# Patient Record
Sex: Female | Born: 1990 | Race: Asian | Hispanic: No | Marital: Married | Smoking: Never smoker
Health system: Southern US, Community
[De-identification: ages and names within clinical notes are randomized; demographics above are authoritative.]

## PROBLEM LIST (undated history)

## (undated) DIAGNOSIS — IMO0002 Reserved for concepts with insufficient information to code with codable children: Secondary | ICD-10-CM

## (undated) DIAGNOSIS — Z789 Other specified health status: Secondary | ICD-10-CM

## (undated) HISTORY — PX: NO PAST SURGERIES: SHX2092

## (undated) HISTORY — PX: HAND SURGERY: SHX662

## (undated) HISTORY — DX: Other specified health status: Z78.9

---

## 2011-09-15 ENCOUNTER — Emergency Department (HOSPITAL_COMMUNITY)
Admission: EM | Admit: 2011-09-15 | Discharge: 2011-09-16 | Disposition: A | Discharge status: Home / Self Care | Payer: Self-pay | Attending: Emergency Medicine | Admitting: Emergency Medicine

## 2011-09-15 ENCOUNTER — Encounter (HOSPITAL_COMMUNITY): Payer: Self-pay | Admitting: *Deleted

## 2011-09-15 DIAGNOSIS — W260XXA Contact with knife, initial encounter: Secondary | ICD-10-CM | POA: Insufficient documentation

## 2011-09-15 DIAGNOSIS — IMO0001 Reserved for inherently not codable concepts without codable children: Secondary | ICD-10-CM

## 2011-09-15 DIAGNOSIS — W01119A Fall on same level from slipping, tripping and stumbling with subsequent striking against unspecified sharp object, initial encounter: Secondary | ICD-10-CM | POA: Insufficient documentation

## 2011-09-15 DIAGNOSIS — R29898 Other symptoms and signs involving the musculoskeletal system: Secondary | ICD-10-CM | POA: Insufficient documentation

## 2011-09-15 DIAGNOSIS — S61209A Unspecified open wound of unspecified finger without damage to nail, initial encounter: Secondary | ICD-10-CM | POA: Insufficient documentation

## 2011-09-15 DIAGNOSIS — W261XXA Contact with sword or dagger, initial encounter: Secondary | ICD-10-CM | POA: Insufficient documentation

## 2011-09-15 NOTE — ED Provider Notes (Signed)
History     CSN: [id number]  Arrival date & time 09/15/11  2050   First MD Initiated Contact with Patient 09/15/11 2115      Chief Complaint  Patient presents with  . Laceration     HPI  History provided by the patient. Patient is 21 year old female with no significant past medical history who presents with complaints of laceration to her left index finger. Patient reports having a fall while holding a knife and cut her left finger. Patient states she had gone out to the store to get a knife for her mother who was cooking. On her way back into the house she slipped on ice while carrying a knife. There was associated bleeding which has now stopped. Patient reports continued pain to the finger especially with palpation or movements. Patient denies any numbness but does report some weakness in the finger. Patient has no other significant medical problems. Patient reports being up-to-date on tetanus shot.     History reviewed. No pertinent past medical history.  History reviewed. No pertinent past surgical history.  History reviewed. No pertinent family history.  History  Substance Use Topics  . Smoking status: Never Smoker   . Smokeless tobacco: Not on file  . Alcohol Use: No    OB History    Grav Para Term Preterm Abortions TAB SAB Ect Mult Living                  Review of Systems  All other systems reviewed and are negative.    Allergies  Review of patient's allergies indicates no known allergies.  Home Medications  No current outpatient prescriptions on file.  BP 103/63  Pulse 60  Temp(Src) 98.2 F (36.8 C) (Oral)  Resp 18  SpO2 100%  Physical Exam  Nursing note and vitals reviewed. Constitutional: She is oriented to person, place, and time. She appears well-developed and well-nourished. No distress.  HENT:  Head: Normocephalic and atraumatic.  Cardiovascular: Normal rate and regular rhythm.   Pulmonary/Chest: Effort normal and breath sounds normal.    Musculoskeletal:       2 cm laceration over the palmar surface of left index finger over the MCP joint. Patient has reduced range of motion and weakness of flexion at DIP joint of index finger. She reports normal distal medial and lateral sensations of finger. There is normal cap Refill of finger.  Neurological: She is alert and oriented to person, place, and time.  Skin: Skin is warm and dry. No rash noted.  Psychiatric: She has a normal mood and affect. Her behavior is normal.    ED Course  Procedures   LACERATION REPAIR Performed by: Angus Seller Authorized by: Angus Seller Consent: Verbal consent obtained. Risks and benefits: risks, benefits and alternatives were discussed Consent given by: patient Patient identity confirmed: provided demographic data Prepped and Draped in normal sterile fashion  Wound explored through full range of motion. There does not appear to be any tendon involvement.  Laceration Location: Left index finger  Laceration Length: 2 cm  No Foreign Bodies seen or palpated  Anesthesia: local infiltration  Local anesthetic: lidocaine 2 % without epinephrine  Anesthetic total: 3 ml  Irrigation method: syringe Amount of cleaning: standard  Skin closure: 4-0 nylon   Number of sutures: 5   Technique: Simple interrupted   Patient tolerance: Patient tolerated the procedure well with no immediate complications.      1. Laceration of second finger of left hand with tendon involvement  MDM  9:15 PM patient seen and evaluated. Patient in no acute distress.  10:00 PM patient discussed with attending physician. He recommends calling orthopedic hand specialist for consult.   11:00 PM spoke with Dr. Melvyn Novas on-call with hand surgery. He would like the wound closed with sutures and he will come down to evaluate patient.  12:00 AM patient has been seen by Dr. Melvyn Novas. He requests that patient be given instructions to return to the emergency  room tomorrow at 1 PM to be seen by him and taken to surgery. He would like patient placed in a finger splint. Patient will also be given instructions to be n.p.o. 8 hours prior to arrival.   Medical screening examination/treatment/procedure(s) were performed by non-physician practitioner and as supervising physician I was immediately available for consultation/collaboration. Osvaldo Human, M.D.    Angus Seller, PA 09/16/11 0032  Carleene Cooper III, MD 09/16/11 [phone]

## 2011-09-15 NOTE — ED Notes (Signed)
Laceration ot the lt index finger she fell onto her knife she carries with her

## 2011-09-15 NOTE — ED Notes (Signed)
P. DAMMEN PA AT BEDSIDE SUTURING PT.'S LACERATION .

## 2011-09-16 ENCOUNTER — Emergency Department (HOSPITAL_COMMUNITY): Payer: Self-pay | Admitting: Anesthesiology

## 2011-09-16 ENCOUNTER — Encounter (HOSPITAL_COMMUNITY): Payer: Self-pay | Admitting: Anesthesiology

## 2011-09-16 ENCOUNTER — Encounter (HOSPITAL_COMMUNITY): Payer: Self-pay | Admitting: *Deleted

## 2011-09-16 ENCOUNTER — Encounter (HOSPITAL_COMMUNITY)
Admission: EM | Disposition: A | Discharge status: Home / Self Care | Payer: Self-pay | Source: Home / Self Care | Attending: Emergency Medicine

## 2011-09-16 ENCOUNTER — Ambulatory Visit (HOSPITAL_COMMUNITY)
Admission: EM | Admit: 2011-09-16 | Discharge: 2011-09-16 | Disposition: A | Discharge status: Home / Self Care | Payer: Self-pay | Attending: Emergency Medicine | Admitting: Emergency Medicine

## 2011-09-16 DIAGNOSIS — W269XXA Contact with unspecified sharp object(s), initial encounter: Secondary | ICD-10-CM | POA: Insufficient documentation

## 2011-09-16 DIAGNOSIS — S61219A Laceration without foreign body of unspecified finger without damage to nail, initial encounter: Secondary | ICD-10-CM

## 2011-09-16 DIAGNOSIS — Y92009 Unspecified place in unspecified non-institutional (private) residence as the place of occurrence of the external cause: Secondary | ICD-10-CM | POA: Insufficient documentation

## 2011-09-16 DIAGNOSIS — IMO0002 Reserved for concepts with insufficient information to code with codable children: Secondary | ICD-10-CM | POA: Insufficient documentation

## 2011-09-16 DIAGNOSIS — Y998 Other external cause status: Secondary | ICD-10-CM | POA: Insufficient documentation

## 2011-09-16 DIAGNOSIS — S61209A Unspecified open wound of unspecified finger without damage to nail, initial encounter: Secondary | ICD-10-CM | POA: Insufficient documentation

## 2011-09-16 LAB — HCG, SERUM, QUALITATIVE: Preg, Serum: NEGATIVE

## 2011-09-16 SURGERY — ARTERY AND TENDON REPAIR
Anesthesia: General | Site: Finger | Laterality: Left | Wound class: Clean

## 2011-09-16 MED ORDER — LACTATED RINGERS IV SOLN
INTRAVENOUS | Status: DC | PRN
  Administered 2011-09-16 (×2): via INTRAVENOUS

## 2011-09-16 MED ORDER — MIDAZOLAM HCL 5 MG/5ML IJ SOLN
INTRAMUSCULAR | Status: DC | PRN
  Administered 2011-09-16: 2 mg via INTRAVENOUS

## 2011-09-16 MED ORDER — FENTANYL CITRATE 0.05 MG/ML IJ SOLN
INTRAMUSCULAR | Status: DC | PRN
  Administered 2011-09-16: 50 ug via INTRAVENOUS
  Administered 2011-09-16: 100 ug via INTRAVENOUS

## 2011-09-16 MED ORDER — DOCUSATE SODIUM 100 MG PO CAPS: 100.0000 mg | ORAL_CAPSULE | Freq: Two times a day (BID) | ORAL | Status: AC

## 2011-09-16 MED ORDER — OXYCODONE-ACETAMINOPHEN 5-325 MG PO TABS: 1.0000 | ORAL_TABLET | ORAL | Status: AC | PRN

## 2011-09-16 MED ORDER — PROPOFOL 10 MG/ML IV EMUL
INTRAVENOUS | Status: DC | PRN
  Administered 2011-09-16: 50 mg via INTRAVENOUS
  Administered 2011-09-16: 200 mg via INTRAVENOUS

## 2011-09-16 MED ORDER — BUPIVACAINE HCL (PF) 0.25 % IJ SOLN
INTRAMUSCULAR | Status: DC | PRN
  Administered 2011-09-16: 8 mL

## 2011-09-16 MED ORDER — ONDANSETRON HCL 4 MG/2ML IJ SOLN
INTRAMUSCULAR | Status: DC | PRN
  Administered 2011-09-16: 4 mg via INTRAVENOUS

## 2011-09-16 MED ORDER — ACETAMINOPHEN 10 MG/ML IV SOLN
INTRAVENOUS | Status: DC | PRN
  Administered 2011-09-16: 1000 mg via INTRAVENOUS

## 2011-09-16 MED ORDER — CEFAZOLIN SODIUM 1-5 GM-% IV SOLN
INTRAVENOUS | Status: DC | PRN
  Administered 2011-09-16: 1 g via INTRAVENOUS

## 2011-09-16 SURGICAL SUPPLY — 54 items
APPLIER CLIP 9.375 SM OPEN (CLIP)
BAG DECANTER FOR FLEXI CONT (MISCELLANEOUS) IMPLANT
BANDAGE ELASTIC 3 VELCRO ST LF (GAUZE/BANDAGES/DRESSINGS) ×2 IMPLANT
BANDAGE ELASTIC 4 VELCRO ST LF (GAUZE/BANDAGES/DRESSINGS) IMPLANT
BANDAGE GAUZE ELAST BULKY 4 IN (GAUZE/BANDAGES/DRESSINGS) ×2 IMPLANT
BNDG ESMARK 4X9 LF (GAUZE/BANDAGES/DRESSINGS) ×2 IMPLANT
CLIP APPLIE 9.375 SM OPEN (CLIP) IMPLANT
CLOTH BEACON ORANGE TIMEOUT ST (SAFETY) ×2 IMPLANT
CORDS BIPOLAR (ELECTRODE) ×2 IMPLANT
COVER SURGICAL LIGHT HANDLE (MISCELLANEOUS) ×2 IMPLANT
CUFF TOURNIQUET SINGLE 18IN (TOURNIQUET CUFF) ×2 IMPLANT
CUFF TOURNIQUET SINGLE 24IN (TOURNIQUET CUFF) IMPLANT
DRAPE OEC MINIVIEW 54X84 (DRAPES) IMPLANT
DRAPE SURG 17X23 STRL (DRAPES) ×2 IMPLANT
DRSG ADAPTIC 3X8 NADH LF (GAUZE/BANDAGES/DRESSINGS) IMPLANT
DRSG EMULSION OIL 3X3 NADH (GAUZE/BANDAGES/DRESSINGS) ×2 IMPLANT
GAUZE XEROFORM 5X9 LF (GAUZE/BANDAGES/DRESSINGS) ×2 IMPLANT
GEL ULTRASOUND 20GR AQUASONIC (MISCELLANEOUS) IMPLANT
GLOVE BIOGEL PI IND STRL 8.5 (GLOVE) ×1 IMPLANT
GLOVE BIOGEL PI INDICATOR 8.5 (GLOVE) ×1
GLOVE SURG ORTHO 8.0 STRL STRW (GLOVE) ×2 IMPLANT
GOWN PREVENTION PLUS XLARGE (GOWN DISPOSABLE) ×2 IMPLANT
GOWN STRL NON-REIN LRG LVL3 (GOWN DISPOSABLE) ×2 IMPLANT
KIT BASIN OR (CUSTOM PROCEDURE TRAY) ×2 IMPLANT
KIT ROOM TURNOVER OR (KITS) ×2 IMPLANT
LOOP VESSEL MAXI BLUE (MISCELLANEOUS) IMPLANT
MANIFOLD NEPTUNE II (INSTRUMENTS) IMPLANT
NEEDLE HYPO 25GX1X1/2 BEV (NEEDLE) IMPLANT
NS IRRIG 1000ML POUR BTL (IV SOLUTION) ×2 IMPLANT
PACK ORTHO EXTREMITY (CUSTOM PROCEDURE TRAY) ×2 IMPLANT
PAD ARMBOARD 7.5X6 YLW CONV (MISCELLANEOUS) ×2 IMPLANT
PAD CAST 4YDX4 CTTN HI CHSV (CAST SUPPLIES) IMPLANT
PADDING CAST COTTON 4X4 STRL (CAST SUPPLIES)
PADDING WEBRIL 3 STERILE (GAUZE/BANDAGES/DRESSINGS) ×2 IMPLANT
SOAP 2 % CHG 4 OZ (WOUND CARE) ×2 IMPLANT
SPEAR EYE SURG WECK-CEL (MISCELLANEOUS) IMPLANT
SPLINT PLASTER 3X15 (CAST SUPPLIES) ×2 IMPLANT
SPONGE GAUZE 4X4 12PLY (GAUZE/BANDAGES/DRESSINGS) IMPLANT
SPONGE GAUZE 4X4 STERILE 39 (GAUZE/BANDAGES/DRESSINGS) ×2 IMPLANT
SUCTION FRAZIER TIP 10 FR DISP (SUCTIONS) ×2 IMPLANT
SUT FIBERWIRE 4-0 18 DIAM BLUE (SUTURE)
SUT FIBERWIRE 4-0 18 TAPR NDL (SUTURE) ×6
SUT MERSILENE 4 0 P 3 (SUTURE) IMPLANT
SUT PROLENE 4 0 P 3 18 (SUTURE) ×2 IMPLANT
SUT PROLENE 4 0 PS 2 18 (SUTURE) IMPLANT
SUT PROLENE 6 0 PC 1 (SUTURE) ×2 IMPLANT
SUTURE FIBERWR 4-0 18 DIA BLUE (SUTURE) IMPLANT
SUTURE FIBERWR 4-0 18 TAPR NDL (SUTURE) ×3 IMPLANT
SYR CONTROL 10ML LL (SYRINGE) IMPLANT
TOWEL OR 17X24 6PK STRL BLUE (TOWEL DISPOSABLE) ×2 IMPLANT
TOWEL OR 17X26 10 PK STRL BLUE (TOWEL DISPOSABLE) ×2 IMPLANT
TUBE CONNECTING 12X1/4 (SUCTIONS) ×2 IMPLANT
UNDERPAD 30X30 INCONTINENT (UNDERPADS AND DIAPERS) ×2 IMPLANT
WATER STERILE IRR 1000ML POUR (IV SOLUTION) ×2 IMPLANT

## 2011-09-16 NOTE — Preoperative (Signed)
Beta Blockers   Reason not to administer Beta Blockers:Not Applicable 

## 2011-09-16 NOTE — ED Notes (Signed)
Ortho enroute to stretcher 8

## 2011-09-16 NOTE — ED Notes (Signed)
Sent here for pre-op care, having surgery to left index finger by dr Orlan Leavens today at 1pm.

## 2011-09-16 NOTE — Anesthesia Procedure Notes (Signed)
Procedure Name: LMA Insertion Date/Time: 09/16/2011 4:26 PM Performed by: Glendora Score Pre-anesthesia Checklist: Patient identified, Emergency Drugs available, Suction available and Patient being monitored Patient Re-evaluated:Patient Re-evaluated prior to inductionOxygen Delivery Method: Circle System Utilized Preoxygenation: Pre-oxygenation with 100% oxygen Intubation Type: IV induction Ventilation: Mask ventilation without difficulty LMA: LMA inserted LMA Size: 3.0 Number of attempts: 1 Placement Confirmation: positive ETCO2 and breath sounds checked- equal and bilateral Tube secured with: Tape Dental Injury: Teeth and Oropharynx as per pre-operative assessment

## 2011-09-16 NOTE — ED Provider Notes (Signed)
History     CSN: [id number]  Arrival date & time 09/16/11  1236   First MD Initiated Contact with Patient 09/16/11 1302      Chief Complaint  Patient presents with  . Pre-op Exam    (Consider location/radiation/quality/duration/timing/severity/associated sxs/prior treatment) The history is provided by the patient.  pt injured left index finger yesterday. Cut finger accidentally at home. Was seen in ed yesterday and felt to have extensor tendon injury. Skin was closed and pt returns today for ortho/hand eval and repair tendon.  Denies other injury. Mild dull pain to area.   History reviewed. No pertinent past medical history.  History reviewed. No pertinent past surgical history.  History reviewed. No pertinent family history.  History  Substance Use Topics  . Smoking status: Never Smoker   . Smokeless tobacco: Not on file  . Alcohol Use: No    OB History    Grav Para Term Preterm Abortions TAB SAB Ect Mult Living                  Review of Systems  Skin: Positive for wound.  Neurological: Negative for numbness.    Allergies  Review of patient's allergies indicates no known allergies.  Home Medications  No current outpatient prescriptions on file.  BP 99/63  Pulse 81  Temp(Src) 98.1 F (36.7 C) (Oral)  Resp 16  SpO2 100%  Physical Exam  Nursing note and vitals reviewed. Constitutional: She appears well-developed and well-nourished. No distress.  Eyes: No scleral icterus.  Neck: No tracheal deviation present.  Cardiovascular: Normal rate.   Pulmonary/Chest: Effort normal. No respiratory distress.  Abdominal: Normal appearance.  Musculoskeletal:       Left index finger splint. Normal cap refill distally.   Neurological: She is alert.  Skin: Skin is warm and dry.  Psychiatric: She has a normal mood and affect.    ED Course  Procedures (including critical care time)     MDM  Dr Melvyn Novas called and informed pt here - plan for pt to OR.          Suzi Roots, MD 09/16/11 [phone]

## 2011-09-16 NOTE — Brief Op Note (Signed)
09/16/2011  5:58 PM  PATIENT:  [name]  21 y.o. female  PRE-OPERATIVE DIAGNOSIS:  left index finger laceration  POST-OPERATIVE DIAGNOSIS:  same  PROCEDURE:  Procedure(s): ARTERY AND TENDON REPAIR  SURGEON:  Surgeon(s): Sharma Covert, MD  PHYSICIAN ASSISTANT:   ASSISTANTS: none   ANESTHESIA:   general  EBL:  Total I/O In: 1000 [I.V.:1000] Out: -   BLOOD ADMINISTERED:none  DRAINS: none   LOCAL MEDICATIONS USED:  MARCAINE 10CC  SPECIMEN:  No Specimen  DISPOSITION OF SPECIMEN:  N/A  COUNTS:  YES  TOURNIQUET:   Total Tourniquet Time Documented: Upper Arm (Left) - 45 minutes  DICTATION: .Other Dictation: Dictation Number [id number]  PLAN OF CARE: Discharge to home after PACU  PATIENT DISPOSITION:  PACU - hemodynamically stable.   Delay start of Pharmacological VTE agent (>24hrs) due to surgical blood loss or risk of bleeding:  {YES/NO/NOT APPLICABLE:20182

## 2011-09-16 NOTE — Anesthesia Preprocedure Evaluation (Signed)
Anesthesia Evaluation  Patient identified by MRN, date of birth, ID band Patient awake    Reviewed: Allergy & Precautions, H&P , NPO status , Patient's Chart, lab work & pertinent test results  Airway Mallampati: I TM Distance: >3 FB Neck ROM: Full    Dental  (+) Teeth Intact   Pulmonary neg pulmonary ROS,  clear to auscultation        Cardiovascular neg cardio ROS Regular     Neuro/Psych Negative Neurological ROS  Negative Psych ROS   GI/Hepatic negative GI ROS, Neg liver ROS,   Endo/Other  Negative Endocrine ROS  Renal/GU negative Renal ROS  Genitourinary negative   Musculoskeletal negative musculoskeletal ROS (+)   Abdominal   Peds negative pediatric ROS (+)  Hematology negative hematology ROS (+)   Anesthesia Other Findings   Reproductive/Obstetrics negative OB ROS                           Anesthesia Physical Anesthesia Plan  ASA: I  Anesthesia Plan: General   Post-op Pain Management:    Induction: Intravenous  Airway Management Planned: LMA  Additional Equipment:   Intra-op Plan:   Post-operative Plan: Extubation in OR  Informed Consent: I have reviewed the patients History and Physical, chart, labs and discussed the procedure including the risks, benefits and alternatives for the proposed anesthesia with the patient or authorized representative who has indicated his/her understanding and acceptance.   Dental advisory given  Plan Discussed with: CRNA, Anesthesiologist and Surgeon  Anesthesia Plan Comments:         Anesthesia Quick Evaluation

## 2011-09-16 NOTE — Op Note (Signed)
[name], [name] NO.:  [id number]  MEDICAL RECORD NO.:  [id number]  LOCATION:  [facility]                         FACILITY:  [facility]  PHYSICIAN:  Madelynn Done, MD  DATE OF BIRTH:  [birth year]  DATE OF PROCEDURE:  09/16/2011 DATE OF DISCHARGE:                              OPERATIVE REPORT   PREOPERATIVE DIAGNOSIS:  Left index finger laceration with tendon involvement.  POSTOPERATIVE DIAGNOSIS:  Left index finger laceration with tendon involvement.  ATTENDING PHYSICIAN:  Madelynn Done, M.D., who was scrubbed and present for the entire procedure.  ASSISTANT SURGEON:  None.  ANESTHESIA:  General via LMA.  TOURNIQUET TIME:  Less than 15 minutes at 250 mmHg.  SURGICAL PROCEDURE: 1. Left index finger repair of flexor digitorum profundus tendon. 2. Left index finger repair of flexor digitorum superficialis tendon. 3. Left index finger radial digital nerve neurolysis. 4. Left index finger traumatic laceration, 3 cm repair.  SURGICAL INDICATIONS:  Mrs. [name] is a 21 year old female who sustained a sharp laceration to the volar surface of her index finger.  The patient was seen and evaluated in the emergency department and it was recommended that she undergo the above procedure.  Risks, benefits, and alternatives were discussed in detail with the patient and a signed informed consent was obtained.  Risks include, but not limited to bleeding, infection, damage to nearby nerves, arteries or tendons, loss of motion of the wrist and digits, tendon rupture, digital stiffness, and need for further surgical intervention.  DESCRIPTION OF PROCEDURE:  The patient was properly identified in the preop holding area and mark with a permanent marker made on the middle and left index fingers to indicate correct operative site.  The patient then brought back to the operating room and placed supine on anesthesia room table and general anesthesia was administered.  The  patient tolerated this well.  Well-padded tourniquet was then placed on the left brachium and sealed with a 1000 drape.  The left upper extremity was then prepped and draped in normal sterile fashion.  Time-out was called, correct side was identified, and procedure was then begun.  Attention was then turned to the left index finger where the patient had a 3-cm incision, was then extended both proximally and distally.  Once this was carried out, Brunner-shaped skin flaps were then raised.  Once the skin flaps were then raised, dissection then carried down to the flexor sheath of the patient's injury.  The radial digital nerves were then carefully dissected free.  It was in continuity.  Digital nerve neurolysis was then carried out removing from the hematoma noted in the region.  Digital nerve neurolysis allow the nerves to be mobilized and protected throughout.  Once this was carried out, the flexor sheath was then opened.  The A1 pulley was then opened.  Following opening of the A1 pulley, the FDP was then identified.  The patient had a complete loss and 100% laceration of the FDP tendon.  The patient did have a 60% laceration to the radial slip of the FDS.  The ulnar side of the FDS had a 20% laceration.  Once this was carried out, further  dissection was then carried down distally where the A3 pulley was then opened up, the tendon was identified proximally.  The FDP was then placed through the A2 pulley and then using a 6-core strand modified Kessler with horizontal mattress suture.  A 6-core strand suture was then placed within the tendon just at the distal edge of the A2 pulley.  Once this was carried out, 6-0 Prolene epitendinous suture was then used to augment the repair.  This was in zone 2.  The FDP was then repaired. Once the FDP was repaired, attention was then turned to the FDS where using a core horizontal mattress suture and 4-0 FiberWire suture, the radial slip of the FDS was  then repaired.  There was good tendon excursion.  The wound was then thoroughly irrigated.  The tourniquet was then deflated.  The skin was then closed using simple Prolene sutures. Adaptic dressing and sterile compressive bandage then applied.  The patient was then placed in a well-padded dorsal blocking splint, extubated and taken to the recovery room in good condition.  POSTOPERATIVE PLAN:  The patient was discharged to home, seen back in the office in approximately 10 days for wound check, suture removal, and then begin a zone 2 FDS and FDP repair protocol at [facility].     Madelynn Done, MD     FWO/MEDQ  D:  09/16/2011  T:  09/16/2011  Job:  [id number]

## 2011-09-16 NOTE — H&P (Signed)
[name] is an 21 y.o. female.   Chief Complaint: LACERATION TO INDEX FINGER. PENETRATING INJURY FROM KNIFE. POOR FLEXION OF FINGER HPI: SEE ED NOTES PER DR. DAVIDSON  No past medical history on file.  No past surgical history on file.  No family history on file. Social History:  reports that she has never smoked. She does not have any smokeless tobacco history on file. She reports that she does not drink alcohol. Her drug history not on file.  Allergies: No Known Allergies  No current facility-administered medications on file as of 09/16/2011.   No current outpatient prescriptions on file as of 09/16/2011.    No results found for this or any previous visit (from the past 48 hour(s)). No results found.  NO RECENT ILNNESSES OR HOSPITALIZATIONS  Blood pressure 99/63, pulse 81, temperature 98.1 F (36.7 C), temperature source Oral, resp. rate 16, SpO2 100.00%. General Appearance:  Alert, cooperative, no distress, appears stated age  Head:  Normocephalic, without obvious abnormality, atraumatic  Eyes:  Pupils equal, conjunctiva/corneas clear,         Throat: Lips, mucosa, and tongue normal; teeth and gums normal  Neck: No visible masses     Lungs:   respirations unlabored  Chest Wall:  No tenderness or deformity  Heart:  Regular rate and rhythm,  Abdomen:   Soft, non-tender,         Extremities: LEFT HAND: INDEX FINGER TRANSVERSE LACERATION AT PROXIMAL EXTENT OF A2 PULLEY UNABLE TO FLEX DIP JOINT TO INDEX. ABLE TO FLEX PIP JOINT. DIMINISHED SENSATION RADIAL BORDER OF DIGIT FINGER TIP WARM WELL PERFUSED GOOD CAP REFIL. NO INJURY TO LONG/RING/SMALL  Pulses: 2+ and symmetric  Skin: Skin color, texture, turgor normal, no rashes or lesions     Neurologic: Normal     Assessment/Plan LEFT INDEX FINGER LACERATION WITH TENDON/NERVE INVOLVEMENT  LEFT INDEX FINGER LACERATION WITH REPAIR OR NERVE/TENDON, FDP AND DIGITAL NERVE  R/B/A DISCUSSED WITH PT IN ED.  PT VOICED  UNDERSTANDING OF PLAN CONSENT SIGNED DAY OF SURGERY PT SEEN AND EXAMINED PRIOR TO OPERATIVE PROCEDURE/DAY OF SURGERY SITE MARKED. QUESTIONS ANSWERED WILL GO HOME FOLLOWING SURGERY  Sharma Covert 09/16/2011, 12:51 PM

## 2011-09-16 NOTE — ED Notes (Signed)
Ortho tech paged  

## 2011-09-16 NOTE — Transfer of Care (Signed)
Immediate Anesthesia Transfer of Care Note  Patient: [name]  Procedure(s) Performed:  ARTERY AND TENDON REPAIR - Left index laceration repair and repair tendon  Patient Location: PACU  Anesthesia Type: General  Level of Consciousness: awake, alert  and patient cooperative  Airway & Oxygen Therapy: Patient Spontanous Breathing and Patient connected to face mask oxygen  Post-op Assessment: Report given to PACU RN  Post vital signs: Reviewed and stable  Complications: No apparent anesthesia complications

## 2011-09-16 NOTE — ED Notes (Signed)
Taken to OR

## 2011-09-16 NOTE — Anesthesia Postprocedure Evaluation (Signed)
  Anesthesia Post-op Note  Patient: [name]  Procedure(s) Performed:  ARTERY AND TENDON REPAIR - Left index laceration repair and repair tendon  Patient Location: PACU  Anesthesia Type: General  Level of Consciousness: sedated  Airway and Oxygen Therapy: Patient Spontanous Breathing and Patient connected to nasal cannula oxygen  Post-op Pain: none  Post-op Assessment: Post-op Vital signs reviewed, Patient's Cardiovascular Status Stable, Respiratory Function Stable, Patent Airway, No signs of Nausea or vomiting and Pain level controlled  Post-op Vital Signs: Reviewed and stable  Complications: No apparent anesthesia complications

## 2011-09-27 ENCOUNTER — Ambulatory Visit: Payer: Self-pay | Attending: Orthopedic Surgery | Admitting: Occupational Therapy

## 2011-09-27 DIAGNOSIS — M25549 Pain in joints of unspecified hand: Secondary | ICD-10-CM | POA: Insufficient documentation

## 2011-09-27 DIAGNOSIS — R279 Unspecified lack of coordination: Secondary | ICD-10-CM | POA: Insufficient documentation

## 2011-09-27 DIAGNOSIS — M6281 Muscle weakness (generalized): Secondary | ICD-10-CM | POA: Insufficient documentation

## 2011-09-27 DIAGNOSIS — IMO0001 Reserved for inherently not codable concepts without codable children: Secondary | ICD-10-CM | POA: Insufficient documentation

## 2011-09-27 DIAGNOSIS — M25649 Stiffness of unspecified hand, not elsewhere classified: Secondary | ICD-10-CM | POA: Insufficient documentation

## 2011-10-03 ENCOUNTER — Ambulatory Visit: Payer: Self-pay | Admitting: Occupational Therapy

## 2011-10-11 ENCOUNTER — Ambulatory Visit: Payer: Self-pay | Admitting: Occupational Therapy

## 2011-10-18 ENCOUNTER — Ambulatory Visit: Payer: Self-pay | Admitting: Occupational Therapy

## 2011-10-23 ENCOUNTER — Ambulatory Visit: Payer: Self-pay | Attending: Orthopedic Surgery | Admitting: Occupational Therapy

## 2011-10-23 DIAGNOSIS — IMO0001 Reserved for inherently not codable concepts without codable children: Secondary | ICD-10-CM | POA: Insufficient documentation

## 2011-10-23 DIAGNOSIS — M6281 Muscle weakness (generalized): Secondary | ICD-10-CM | POA: Insufficient documentation

## 2011-10-23 DIAGNOSIS — M25549 Pain in joints of unspecified hand: Secondary | ICD-10-CM | POA: Insufficient documentation

## 2011-10-23 DIAGNOSIS — M25649 Stiffness of unspecified hand, not elsewhere classified: Secondary | ICD-10-CM | POA: Insufficient documentation

## 2011-10-23 DIAGNOSIS — R279 Unspecified lack of coordination: Secondary | ICD-10-CM | POA: Insufficient documentation

## 2011-10-25 ENCOUNTER — Encounter: Payer: Self-pay | Admitting: Occupational Therapy

## 2011-10-26 ENCOUNTER — Ambulatory Visit: Payer: Self-pay | Admitting: Occupational Therapy

## 2011-10-30 ENCOUNTER — Ambulatory Visit: Payer: Self-pay | Admitting: Occupational Therapy

## 2011-11-01 ENCOUNTER — Ambulatory Visit: Payer: Self-pay | Admitting: Occupational Therapy

## 2011-11-06 ENCOUNTER — Ambulatory Visit: Payer: Self-pay | Admitting: Occupational Therapy

## 2011-11-08 ENCOUNTER — Ambulatory Visit: Payer: Self-pay | Admitting: Occupational Therapy

## 2011-11-13 ENCOUNTER — Ambulatory Visit: Payer: Self-pay | Admitting: Occupational Therapy

## 2011-11-15 ENCOUNTER — Ambulatory Visit: Payer: Self-pay | Admitting: Occupational Therapy

## 2012-03-22 ENCOUNTER — Encounter (HOSPITAL_COMMUNITY): Payer: Self-pay

## 2012-03-22 ENCOUNTER — Encounter (HOSPITAL_COMMUNITY)
Admission: RE | Admit: 2012-03-22 | Discharge: 2012-03-22 | Disposition: A | Discharge status: Home / Self Care | Payer: Self-pay | Source: Ambulatory Visit | Attending: Orthopedic Surgery | Admitting: Orthopedic Surgery

## 2012-03-22 ENCOUNTER — Encounter (HOSPITAL_COMMUNITY): Payer: Self-pay | Admitting: Pharmacy Technician

## 2012-03-22 HISTORY — DX: Reserved for concepts with insufficient information to code with codable children: IMO0002

## 2012-03-22 LAB — CBC
MCHC: 34.1 g/dL (ref 30.0–36.0)
Platelets: 310 10*3/uL (ref 150–400)
RDW: 13 % (ref 11.5–15.5)

## 2012-03-22 LAB — SURGICAL PCR SCREEN
MRSA, PCR: NEGATIVE
Staphylococcus aureus: NEGATIVE

## 2012-03-22 LAB — HCG, SERUM, QUALITATIVE: Preg, Serum: NEGATIVE

## 2012-03-22 NOTE — Pre-Procedure Instructions (Signed)
21 [name]  03/22/2012   Your procedure is scheduled on:  03/25/2012  Report to [facility] at 9:00 AM.  Call this number if you have problems the morning of surgery: [phone]   Remember:   Do not eat food or drink liquids :After Midnight.      Take these medicines the morning of surgery with A SIP OF WATER: NOTHING   Do not wear jewelry, make-up or nail polish.  Do not wear lotions, powders, or perfumes. You may wear deodorant.  Do not shave 48 hours prior to surgery. Men may shave face and neck.  Do not bring valuables to the hospital.  Contacts, dentures or bridgework may not be worn into surgery.  Leave suitcase in the car. After surgery it may be brought to your room.  For patients admitted to the hospital, checkout time is 11:00 AM the day of discharge.   Patients discharged the day of surgery will not be allowed to drive home.  Name and phone number of your driver: /w uncle   Special Instructions: CHG Shower Use Special Wash: 1/2 bottle night before surgery and 1/2 bottle morning of surgery.   Please read over the following fact sheets that you were given: Pain Booklet, Coughing and Deep Breathing, MRSA Information and Surgical Site Infection Prevention

## 2012-03-25 ENCOUNTER — Ambulatory Visit (HOSPITAL_COMMUNITY): Payer: Self-pay | Admitting: Anesthesiology

## 2012-03-25 ENCOUNTER — Encounter (HOSPITAL_COMMUNITY): Payer: Self-pay | Admitting: Anesthesiology

## 2012-03-25 ENCOUNTER — Encounter (HOSPITAL_COMMUNITY)
Admission: RE | Disposition: A | Discharge status: Home / Self Care | Payer: Self-pay | Source: Ambulatory Visit | Attending: Orthopedic Surgery

## 2012-03-25 ENCOUNTER — Ambulatory Visit (HOSPITAL_COMMUNITY)
Admission: RE | Admit: 2012-03-25 | Discharge: 2012-03-25 | Disposition: A | Discharge status: Home / Self Care | Payer: Self-pay | Source: Ambulatory Visit | Attending: Orthopedic Surgery | Admitting: Orthopedic Surgery

## 2012-03-25 ENCOUNTER — Encounter (HOSPITAL_COMMUNITY): Payer: Self-pay | Admitting: *Deleted

## 2012-03-25 DIAGNOSIS — Z01812 Encounter for preprocedural laboratory examination: Secondary | ICD-10-CM | POA: Insufficient documentation

## 2012-03-25 DIAGNOSIS — X58XXXA Exposure to other specified factors, initial encounter: Secondary | ICD-10-CM | POA: Insufficient documentation

## 2012-03-25 DIAGNOSIS — M6789 Other specified disorders of synovium and tendon, multiple sites: Secondary | ICD-10-CM | POA: Insufficient documentation

## 2012-03-25 DIAGNOSIS — S61209A Unspecified open wound of unspecified finger without damage to nail, initial encounter: Secondary | ICD-10-CM | POA: Insufficient documentation

## 2012-03-25 HISTORY — PX: TENDON REPAIR: SHX5111

## 2012-03-25 SURGERY — TENDON REPAIR
Anesthesia: General | Site: Finger | Laterality: Left | Wound class: Clean

## 2012-03-25 MED ORDER — PROPOFOL 10 MG/ML IV EMUL
INTRAVENOUS | Status: DC | PRN
  Administered 2012-03-25: 30 mL via INTRAVENOUS
  Administered 2012-03-25: 170 mL via INTRAVENOUS

## 2012-03-25 MED ORDER — BUPIVACAINE HCL (PF) 0.25 % IJ SOLN
INTRAMUSCULAR | Status: DC | PRN
  Administered 2012-03-25: 7 mL

## 2012-03-25 MED ORDER — ACETAMINOPHEN 10 MG/ML IV SOLN
INTRAVENOUS | Status: AC
  Filled 2012-03-25: qty 100

## 2012-03-25 MED ORDER — LIDOCAINE HCL (CARDIAC) 20 MG/ML IV SOLN
INTRAVENOUS | Status: DC | PRN
  Administered 2012-03-25: 100 mg via INTRAVENOUS

## 2012-03-25 MED ORDER — CEFAZOLIN SODIUM-DEXTROSE 2-3 GM-% IV SOLR: 2.0000 g | INTRAVENOUS | Status: DC

## 2012-03-25 MED ORDER — KCL IN DEXTROSE-NACL 20-5-0.45 MEQ/L-%-% IV SOLN: INTRAVENOUS | Status: DC

## 2012-03-25 MED ORDER — DEXAMETHASONE SODIUM PHOSPHATE 10 MG/ML IJ SOLN
INTRAMUSCULAR | Status: DC | PRN
  Administered 2012-03-25: 10 mg via INTRAVENOUS

## 2012-03-25 MED ORDER — HYDROCODONE-ACETAMINOPHEN 5-500 MG PO TABS: 1.0000 | ORAL_TABLET | Freq: Four times a day (QID) | ORAL | Status: AC | PRN

## 2012-03-25 MED ORDER — LACTATED RINGERS IV SOLN
INTRAVENOUS | Status: DC | PRN
  Administered 2012-03-25: 10:00:00 via INTRAVENOUS

## 2012-03-25 MED ORDER — CEFAZOLIN SODIUM-DEXTROSE 2-3 GM-% IV SOLR
INTRAVENOUS | Status: AC
  Administered 2012-03-25: 2 g via INTRAVENOUS
  Filled 2012-03-25: qty 50

## 2012-03-25 MED ORDER — 0.9 % SODIUM CHLORIDE (POUR BTL) OPTIME
TOPICAL | Status: DC | PRN
  Administered 2012-03-25: 1000 mL

## 2012-03-25 MED ORDER — LACTATED RINGERS IV SOLN
INTRAVENOUS | Status: DC
  Administered 2012-03-25: 10:00:00 via INTRAVENOUS

## 2012-03-25 MED ORDER — BUPIVACAINE HCL (PF) 0.25 % IJ SOLN
INTRAMUSCULAR | Status: AC
  Filled 2012-03-25: qty 30

## 2012-03-25 MED ORDER — DOCUSATE SODIUM 100 MG PO CAPS: 100.0000 mg | ORAL_CAPSULE | Freq: Two times a day (BID) | ORAL | Status: AC

## 2012-03-25 MED ORDER — HYDROMORPHONE HCL PF 1 MG/ML IJ SOLN
0.2500 mg | INTRAMUSCULAR | Status: DC | PRN
  Administered 2012-03-25 (×2): 0.5 mg via INTRAVENOUS

## 2012-03-25 MED ORDER — DEXTROSE 5 % IV SOLN
INTRAVENOUS | Status: DC | PRN
  Administered 2012-03-25 (×2): via INTRAVENOUS

## 2012-03-25 MED ORDER — ONDANSETRON HCL 4 MG/2ML IJ SOLN
4.0000 mg | Freq: Once | INTRAMUSCULAR | Status: AC | PRN
  Administered 2012-03-25: 4 mg via INTRAVENOUS

## 2012-03-25 MED ORDER — FENTANYL CITRATE 0.05 MG/ML IJ SOLN
INTRAMUSCULAR | Status: DC | PRN
  Administered 2012-03-25 (×2): 25 ug via INTRAVENOUS
  Administered 2012-03-25 (×2): 100 ug via INTRAVENOUS

## 2012-03-25 MED ORDER — ACETAMINOPHEN 10 MG/ML IV SOLN
INTRAVENOUS | Status: DC | PRN
  Administered 2012-03-25: 1000 mg via INTRAVENOUS

## 2012-03-25 MED ORDER — ONDANSETRON HCL 4 MG/2ML IJ SOLN
INTRAMUSCULAR | Status: AC
  Filled 2012-03-25: qty 2

## 2012-03-25 MED ORDER — CHLORHEXIDINE GLUCONATE 4 % EX LIQD: 60.0000 mL | Freq: Once | CUTANEOUS | Status: DC

## 2012-03-25 MED ORDER — HYDROMORPHONE HCL PF 1 MG/ML IJ SOLN
INTRAMUSCULAR | Status: AC
  Filled 2012-03-25: qty 1

## 2012-03-25 MED ORDER — ONDANSETRON HCL 4 MG/2ML IJ SOLN
INTRAMUSCULAR | Status: DC | PRN
  Administered 2012-03-25: 4 mg via INTRAVENOUS

## 2012-03-25 SURGICAL SUPPLY — 57 items
BANDAGE CONFORM 2  STR LF (GAUZE/BANDAGES/DRESSINGS) ×2 IMPLANT
BANDAGE ELASTIC 3 VELCRO ST LF (GAUZE/BANDAGES/DRESSINGS) ×2 IMPLANT
BANDAGE ELASTIC 4 VELCRO ST LF (GAUZE/BANDAGES/DRESSINGS) IMPLANT
BANDAGE GAUZE ELAST BULKY 4 IN (GAUZE/BANDAGES/DRESSINGS) IMPLANT
BNDG COHESIVE 1X5 TAN STRL LF (GAUZE/BANDAGES/DRESSINGS) IMPLANT
BNDG ELASTIC 2 VLCR STRL LF (GAUZE/BANDAGES/DRESSINGS) ×2 IMPLANT
BNDG ESMARK 4X9 LF (GAUZE/BANDAGES/DRESSINGS) ×2 IMPLANT
CANISTER SUCTION 2500CC (MISCELLANEOUS) ×2 IMPLANT
CLOTH BEACON ORANGE TIMEOUT ST (SAFETY) ×2 IMPLANT
CORDS BIPOLAR (ELECTRODE) ×2 IMPLANT
COVER SURGICAL LIGHT HANDLE (MISCELLANEOUS) ×2 IMPLANT
CUFF TOURNIQUET SINGLE 18IN (TOURNIQUET CUFF) ×2 IMPLANT
CUFF TOURNIQUET SINGLE 24IN (TOURNIQUET CUFF) IMPLANT
DRAPE SURG 17X23 STRL (DRAPES) ×2 IMPLANT
DRSG ADAPTIC 3X8 NADH LF (GAUZE/BANDAGES/DRESSINGS) IMPLANT
DRSG EMULSION OIL 3X3 NADH (GAUZE/BANDAGES/DRESSINGS) ×2 IMPLANT
GAUZE SPONGE 2X2 8PLY STRL LF (GAUZE/BANDAGES/DRESSINGS) ×1 IMPLANT
GLOVE BIOGEL PI IND STRL 7.0 (GLOVE) ×3 IMPLANT
GLOVE BIOGEL PI IND STRL 8.5 (GLOVE) ×1 IMPLANT
GLOVE BIOGEL PI INDICATOR 7.0 (GLOVE) ×3
GLOVE BIOGEL PI INDICATOR 8.5 (GLOVE) ×1
GLOVE SURG ORTHO 8.0 STRL STRW (GLOVE) ×2 IMPLANT
GLOVE SURG SS PI 6.5 STRL IVOR (GLOVE) ×4 IMPLANT
GLOVE SURG SS PI 7.5 STRL IVOR (GLOVE) ×2 IMPLANT
GOWN PREVENTION PLUS XLARGE (GOWN DISPOSABLE) ×2 IMPLANT
GOWN SRG XL XLNG 56XLVL 4 (GOWN DISPOSABLE) ×2 IMPLANT
GOWN STRL NON-REIN LRG LVL3 (GOWN DISPOSABLE) ×2 IMPLANT
GOWN STRL NON-REIN XL XLG LVL4 (GOWN DISPOSABLE) ×2
KIT BASIN OR (CUSTOM PROCEDURE TRAY) ×2 IMPLANT
KIT ROOM TURNOVER OR (KITS) ×2 IMPLANT
MANIFOLD NEPTUNE II (INSTRUMENTS) IMPLANT
NEEDLE HYPO 25GX1X1/2 BEV (NEEDLE) ×2 IMPLANT
NS IRRIG 1000ML POUR BTL (IV SOLUTION) ×2 IMPLANT
PACK ORTHO EXTREMITY (CUSTOM PROCEDURE TRAY) ×2 IMPLANT
PAD ARMBOARD 7.5X6 YLW CONV (MISCELLANEOUS) ×4 IMPLANT
PAD CAST 3X4 CTTN HI CHSV (CAST SUPPLIES) ×1 IMPLANT
PAD CAST 4YDX4 CTTN HI CHSV (CAST SUPPLIES) IMPLANT
PADDING CAST COTTON 3X4 STRL (CAST SUPPLIES) ×1
PADDING CAST COTTON 4X4 STRL (CAST SUPPLIES)
SOAP 2 % CHG 4 OZ (WOUND CARE) ×2 IMPLANT
SPECIMEN JAR SMALL (MISCELLANEOUS) IMPLANT
SPONGE GAUZE 2X2 STER 10/PKG (GAUZE/BANDAGES/DRESSINGS) ×1
SPONGE GAUZE 4X4 12PLY (GAUZE/BANDAGES/DRESSINGS) ×2 IMPLANT
STOCKINETTE TUBULAR SYNTH 2IN (CAST SUPPLIES) ×2 IMPLANT
SUCTION FRAZIER TIP 10 FR DISP (SUCTIONS) ×2 IMPLANT
SUT ETHIBOND 4 0 TF (SUTURE) ×2 IMPLANT
SUT MERSILENE 4 0 P 3 (SUTURE) IMPLANT
SUT PROLENE 4 0 PS 2 18 (SUTURE) ×6 IMPLANT
SUT VIC AB 2-0 CT1 27 (SUTURE)
SUT VIC AB 2-0 CT1 TAPERPNT 27 (SUTURE) IMPLANT
SYR BULB 3OZ (MISCELLANEOUS) ×2 IMPLANT
SYR CONTROL 10ML LL (SYRINGE) ×2 IMPLANT
TOWEL OR 17X24 6PK STRL BLUE (TOWEL DISPOSABLE) ×2 IMPLANT
TOWEL OR 17X26 10 PK STRL BLUE (TOWEL DISPOSABLE) ×2 IMPLANT
TUBE CONNECTING 12X1/4 (SUCTIONS) IMPLANT
UNDERPAD 30X30 INCONTINENT (UNDERPADS AND DIAPERS) ×2 IMPLANT
WATER STERILE IRR 1000ML POUR (IV SOLUTION) IMPLANT

## 2012-03-25 NOTE — H&P (Signed)
[name] is an 21 y.o. female.   Chief Complaint: Left index finger laceration with tendon involvement pt with tendon adhesions and lack of mobility following surgery HPI: Pt followed in office for left hand  Pt with poor digital mobility Here for tenolysis to help with mobility of finger Pt elects surgery  Past Medical History  Diagnosis Date  . Tendon Injury     L hand    Past Surgical History  Procedure Date  . Hand surgery     08/2011- artery & tendon repair- post trauma     History reviewed. No pertinent family history. Social History:  reports that she has never smoked. She does not have any smokeless tobacco history on file. She reports that she does not drink alcohol or use illicit drugs.  Allergies: No Known Allergies  No prescriptions prior to admission    No results found for this or any previous visit (from the past 48 hour(s)). No results found.  No recent illnesses or hospitalizations  Blood pressure 115/73, pulse 73, temperature 98.5 F (36.9 C), temperature source Oral, resp. rate 18, SpO2 97.00%. General Appearance:  Alert, cooperative, no distress, appears stated age  Head:  Normocephalic, without obvious abnormality, atraumatic  Eyes:  Pupils equal, conjunctiva/corneas clear,         Throat: Lips, mucosa, and tongue normal; teeth and gums normal  Neck: No visible masses     Lungs:   respirations unlabored  Chest Wall:  No tenderness or deformity  Heart:  Regular rate and rhythm,  Abdomen:   Soft, non-tender,         Extremities: Left hand: well healed incision over index finger Limited pip and ip mobility Finger warm well perfused Good mobility of long and ring   Pulses: 2+ and symmetric  Skin: Skin color, texture, turgor normal, no rashes or lesions     Neurologic: Normal    Assessment/Plan Left index finger zone II flexor tendon repair with tendon adhesions  Left index finger tenolysis and or reconstruction  R/B/A DISCUSSED WITH PT  IN OFFICE.  PT VOICED UNDERSTANDING OF PLAN CONSENT SIGNED DAY OF SURGERY PT SEEN AND EXAMINED PRIOR TO OPERATIVE PROCEDURE/DAY OF SURGERY SITE MARKED. QUESTIONS ANSWERED WILL GO HOME FOLLOWING SURGERY  Sharma Covert 03/25/2012, 11:12 AM

## 2012-03-25 NOTE — Anesthesia Postprocedure Evaluation (Signed)
  Anesthesia Post-op Note  Patient: [name]  Procedure(s) Performed: Procedure(s) (LRB): TENDON REPAIR (Left)  Patient Location: PACU  Anesthesia Type: General  Level of Consciousness: awake, alert , oriented and patient cooperative  Airway and Oxygen Therapy: Patient Spontanous Breathing and Patient connected to nasal cannula oxygen  Post-op Pain: mild  Post-op Assessment: Post-op Vital signs reviewed, Patient's Cardiovascular Status Stable, Respiratory Function Stable, Patent Airway, No signs of Nausea or vomiting and Pain level controlled  Post-op Vital Signs: stable  Complications: No apparent anesthesia complications

## 2012-03-25 NOTE — Anesthesia Preprocedure Evaluation (Addendum)
Anesthesia Evaluation  Patient identified by MRN, date of birth, ID band Patient awake    Reviewed: Allergy & Precautions, H&P , NPO status , Patient's Chart, lab work & pertinent test results  Airway Mallampati: I TM Distance: >3 FB Neck ROM: full    Dental  (+) Teeth Intact and Dental Advisory Given   Pulmonary    Pulmonary exam normal       Cardiovascular Exercise Tolerance: Good Rhythm:regular Rate:Normal     Neuro/Psych Left index finger tendon injury in Jan 2013.  Now for release of scar tissue    GI/Hepatic Neg liver ROS,   Endo/Other  negative endocrine ROS  Renal/GU negative Renal ROS     Musculoskeletal negative musculoskeletal ROS (+)   Abdominal   Peds negative pediatric ROS (+)  Hematology negative hematology ROS (+)   Anesthesia Other Findings   Reproductive/Obstetrics negative OB ROS                         Anesthesia Physical Anesthesia Plan  ASA: I  Anesthesia Plan: General   Post-op Pain Management:    Induction: Intravenous  Airway Management Planned: LMA  Additional Equipment:   Intra-op Plan:   Post-operative Plan: Extubation in OR  Informed Consent: I have reviewed the patients History and Physical, chart, labs and discussed the procedure including the risks, benefits and alternatives for the proposed anesthesia with the patient or authorized representative who has indicated his/her understanding and acceptance.   Dental advisory given  Plan Discussed with: CRNA, Anesthesiologist and Surgeon  Anesthesia Plan Comments:        Anesthesia Quick Evaluation

## 2012-03-25 NOTE — Brief Op Note (Signed)
03/25/2012  11:15 AM  PATIENT:  [name]  21 y.o. female  PRE-OPERATIVE DIAGNOSIS:  left index finger tendon adhesion   POST-OPERATIVE DIAGNOSIS:  Left index finger adhesion  PROCEDURE:  Procedure(s) (LRB): TENDON REPAIR (Left)  SURGEON:  Surgeon(s) and Role:    * Sharma Covert, MD - Primary  PHYSICIAN ASSISTANT: none  ASSISTANTS: none   ANESTHESIA:   general  EBL:  minimal  BLOOD ADMINISTERED:none  DRAINS: none   LOCAL MEDICATIONS USED:  MARCAINE     SPECIMEN:  No Specimen  DISPOSITION OF SPECIMEN:  N/A  COUNTS:  YES  TOURNIQUET:  * Missing tourniquet times found for documented tourniquets in log:  [id number] *  DICTATION: .Other Dictation: Dictation Number [id number]  PLAN OF CARE: Discharge to home after PACU  PATIENT DISPOSITION:  PACU - hemodynamically stable.   Delay start of Pharmacological VTE agent (>24hrs) due to surgical blood loss or risk of bleeding: not applicable

## 2012-03-25 NOTE — Transfer of Care (Signed)
Immediate Anesthesia Transfer of Care Note  Patient: [name]  Procedure(s) Performed: Procedure(s) (LRB): TENDON REPAIR (Left)  Patient Location: PACU  Anesthesia Type: General  Level of Consciousness: awake, alert , oriented and patient cooperative  Airway & Oxygen Therapy: Patient Spontanous Breathing and Patient connected to nasal cannula oxygen  Post-op Assessment: Report given to PACU RN and Post -op Vital signs reviewed and stable  Post vital signs: Reviewed and stable  Complications: No apparent anesthesia complications

## 2012-03-25 NOTE — Anesthesia Procedure Notes (Signed)
Procedure Name: LMA Insertion Date/Time: 03/25/2012 11:23 AM Performed by: Tyrone Nine Pre-anesthesia Checklist: Patient identified, Emergency Drugs available, Suction available, Patient being monitored and Timeout performed Patient Re-evaluated:Patient Re-evaluated prior to inductionOxygen Delivery Method: Circle system utilized Preoxygenation: Pre-oxygenation with 100% oxygen Intubation Type: IV induction Ventilation: Mask ventilation without difficulty LMA: LMA inserted LMA Size: 4.0 Number of attempts: 1 Tube secured with: Tape Dental Injury: Teeth and Oropharynx as per pre-operative assessment

## 2012-03-25 NOTE — Preoperative (Signed)
Beta Blockers   Reason not to administer Beta Blockers:Not Applicable 

## 2012-03-25 NOTE — Progress Notes (Signed)
Pt slightly nauseated but no emesis. States feeling better. Ready for discharge.

## 2012-03-26 ENCOUNTER — Encounter (HOSPITAL_COMMUNITY): Payer: Self-pay | Admitting: Orthopedic Surgery

## 2012-03-26 NOTE — Op Note (Signed)
[name], [name] NO.:  [id number]  MEDICAL RECORD NO.:  [id number]  LOCATION:  [facility]                         FACILITY:  [facility]  PHYSICIAN:  Madelynn Done, MD  DATE OF BIRTH:  May 30, 1991  DATE OF PROCEDURE:  03/25/2012 DATE OF DISCHARGE:  03/25/2012                              OPERATIVE REPORT   PREOPERATIVE DIAGNOSIS:  Left index finger zone 2 flexor tendon laceration with tendon adhesions.  POSTOPERATIVE DIAGNOSES: 1. Left index finger zone 2 flexor tendon laceration with tendon     adhesions. 2. Left index finger flexor digitorum profundus rupture.  ANESTHESIA:  General via LMA.  TOURNIQUET TIME:  Less than 1 hour at 250 mmHg.  SURGICAL PROCEDURE: 1. Left index finger tenolysis, FDS. 2. Left index finger tendon excision, ruptured FDP.  SURGICAL INDICATION:  Mrs. [name] is a 21 year old female who has zone 2 flexor tendon laceration.  The patient had tendon adhesions and lack of mobility of the finger and like to undergo the above procedure.  Risks, benefits, and alternatives were discussed in detail with the patient and signed informed consent was obtained.  Risks include, but not limited to bleeding, infection, damage to nearby nerves, arteries, or tendons, loss of motion of wrist and digits, and need for further surgical intervention.  INTRAOPERATIVE FINDINGS:  The patient did have a good competent FDS, did have rupture of the FDP.  The patient is on a poor tendon quality of the FDP and the tendon sheath was in continuity A2 pulley.  It was decided to instead of placing tendon graft proving more bulb within the tendon sheath, which was small on itself and elected to proceed with leaving FDS, excising the FDP and giving of an FDS finger.  DESCRIPTION OF PROCEDURE:  The patient was properly identified in the preoperative holding area and marked with a permanent marker made on the left index finger.  The patient was then brought back to the  operating room and placed supine on the anesthesia room table where general anesthesia was administered.  The patient tolerated this well.  A well- padded tourniquet was then placed on the left brachium and sealed with 1000 drape.  The left upper extremity was then prepped and draped in normal sterile fashion.  Time-out was called, correct side was identified, and the procedure was then begun.  Attention was then turned to the left index finger, but the previous skin incision was opened. Dissection was then carried out through the skin, subcutaneous tissue down to the flexor sheath, but the patient did have rupture of the FDP. The FDP was at poor tendon quality.  I did not retract it, it was stuck and grossly adherent to the FDS.  The FDS was in continuity.  There was good tissue quality of the FDS.  A2 pulley still remained in relatively good quality.  Preservation of the A2 pulley was done throughout. Repair of the FDS was then examined and good quality repair of the FDS. Incision was then made given the poor tendon sheath, not to do a tendon grafting procedure more so to take the FDP out, debulk the tendon sheath and try to allow  her with a functional FDS finger.  Following this, completion of the tenolysis was then carried out.  Tenolysis was then carried all the way down to the distal insertion.  The patient did have some adhesions between the FDP and FDS distally.  The distal insertion of the FDS was then reinforced with several 4-0 Ethibond suture after takedown of the adhesions between the FDP and FDS.  Once these were augmented almost creating a tenodesis to the torque, the wound was then thoroughly irrigated.  After copious wound irrigation, the skin was then closed using simple Prolene sutures.  The Adaptic dressing was then applied.  Sterile compressive bandage was then applied.  The patient was then placed in a small finger dressing, extubated and taken to the recovery room  in good condition.  POSTPROCEDURE PLAN:  The patient will be seen back in the office in approximately 1 week for wound check and then will get her into the therapist at [facility] to begin some active range of motion and passive mobility keeping the tendon gliding.  PROGNOSIS:  I think the patient will have some improvement in active mobility of the PIP joint, but we can get the FDP to pull-through giving her FDS function would be more than she had.  Again, I do not think that given the small diameter of the tendon sheath, the patient would have done well with the tendon grafting procedure and excising the FDS and I think it would have cause more adhesion, it was still not the same position over takeoff of the finger.  There were some active PIP movement, she will be more functional.     Madelynn Done, MD     FWO/MEDQ  D:  03/25/2012  T:  03/26/2012  Job:  [id number]

## 2012-03-27 ENCOUNTER — Ambulatory Visit: Payer: Self-pay | Attending: Orthopedic Surgery | Admitting: Occupational Therapy

## 2012-03-27 DIAGNOSIS — IMO0001 Reserved for inherently not codable concepts without codable children: Secondary | ICD-10-CM | POA: Insufficient documentation

## 2012-03-27 DIAGNOSIS — M6281 Muscle weakness (generalized): Secondary | ICD-10-CM | POA: Insufficient documentation

## 2012-03-27 DIAGNOSIS — M25549 Pain in joints of unspecified hand: Secondary | ICD-10-CM | POA: Insufficient documentation

## 2012-03-27 DIAGNOSIS — M25649 Stiffness of unspecified hand, not elsewhere classified: Secondary | ICD-10-CM | POA: Insufficient documentation

## 2012-03-28 ENCOUNTER — Ambulatory Visit: Payer: Self-pay | Admitting: *Deleted

## 2012-04-03 ENCOUNTER — Ambulatory Visit: Payer: Self-pay | Admitting: Occupational Therapy

## 2012-04-05 ENCOUNTER — Ambulatory Visit: Payer: Self-pay | Admitting: Occupational Therapy

## 2012-04-08 ENCOUNTER — Ambulatory Visit: Payer: Self-pay | Admitting: *Deleted

## 2012-04-11 ENCOUNTER — Encounter: Payer: Self-pay | Admitting: *Deleted

## 2012-04-12 ENCOUNTER — Ambulatory Visit: Payer: Self-pay | Admitting: Occupational Therapy

## 2012-04-15 ENCOUNTER — Ambulatory Visit: Payer: Self-pay | Admitting: Occupational Therapy

## 2012-04-17 ENCOUNTER — Ambulatory Visit: Payer: Self-pay | Admitting: Occupational Therapy

## 2012-04-23 ENCOUNTER — Ambulatory Visit: Payer: Self-pay | Attending: Orthopedic Surgery | Admitting: Occupational Therapy

## 2012-04-23 DIAGNOSIS — M6281 Muscle weakness (generalized): Secondary | ICD-10-CM | POA: Insufficient documentation

## 2012-04-23 DIAGNOSIS — IMO0001 Reserved for inherently not codable concepts without codable children: Secondary | ICD-10-CM | POA: Insufficient documentation

## 2012-04-23 DIAGNOSIS — M25549 Pain in joints of unspecified hand: Secondary | ICD-10-CM | POA: Insufficient documentation

## 2012-04-23 DIAGNOSIS — M25649 Stiffness of unspecified hand, not elsewhere classified: Secondary | ICD-10-CM | POA: Insufficient documentation

## 2012-04-25 ENCOUNTER — Ambulatory Visit: Payer: Self-pay | Admitting: Occupational Therapy

## 2012-05-08 ENCOUNTER — Ambulatory Visit: Payer: Self-pay | Admitting: Occupational Therapy

## 2012-05-10 ENCOUNTER — Ambulatory Visit: Payer: Self-pay | Admitting: *Deleted

## 2012-05-14 ENCOUNTER — Ambulatory Visit: Payer: Self-pay | Admitting: Occupational Therapy

## 2012-05-17 ENCOUNTER — Ambulatory Visit: Payer: Self-pay | Admitting: Occupational Therapy

## 2012-05-20 ENCOUNTER — Encounter: Payer: Self-pay | Admitting: Occupational Therapy

## 2012-05-23 ENCOUNTER — Encounter: Payer: Self-pay | Admitting: Occupational Therapy

## 2012-08-21 NOTE — L&D Delivery Note (Addendum)
Delivery Note At 1:08 AM a viable female was delivered via Vaginal, Spontaneous Delivery (Presentation: ; Occiput Posterior).  APGAR: 8, 9; weight 7 lb 15.5 oz (3615 g).   Placenta status: Intact, Spontaneous.  Cord: 2 vessels with the following complications: None.  Cord pH: NA  Anesthesia: Epidural  Episiotomy: None Lacerations: Sulcus Suture Repair: NA Est. Blood Loss (mL): 500  Mom to postpartum.  Baby to Nursery. Placenta to: BS Feeding: Breast Circ: No Contraception: Adrian Prows, Laura Radilla 06/24/2013, 9:44 AM

## 2012-10-24 ENCOUNTER — Other Ambulatory Visit: Payer: Medicaid Other

## 2012-10-24 NOTE — Progress Notes (Signed)
Pt here with proof of pregnancy from [facility].  OB labs/US scheduled.

## 2012-10-25 LAB — HIV ANTIBODY (ROUTINE TESTING W REFLEX): HIV: NONREACTIVE

## 2012-10-27 LAB — OBSTETRIC PANEL
Basophils Absolute: 0 10*3/uL (ref 0.0–0.1)
Eosinophils Relative: 1 % (ref 0–5)
Hepatitis B Surface Ag: NEGATIVE
Lymphocytes Relative: 28 % (ref 12–46)
Lymphs Abs: 2.7 10*3/uL (ref 0.7–4.0)
Neutro Abs: 5.5 10*3/uL (ref 1.7–7.7)
Neutrophils Relative %: 59 % (ref 43–77)
Platelets: 268 10*3/uL (ref 150–400)
RBC: 3.88 MIL/uL (ref 3.87–5.11)
RDW: 13.1 % (ref 11.5–15.5)
Rubella: 15.2 Index — ABNORMAL HIGH (ref <0.90)
WBC: 9.4 10*3/uL (ref 4.0–10.5)

## 2012-10-29 ENCOUNTER — Encounter: Payer: Self-pay | Admitting: *Deleted

## 2012-10-29 ENCOUNTER — Ambulatory Visit (HOSPITAL_COMMUNITY)
Admission: RE | Admit: 2012-10-29 | Discharge: 2012-10-29 | Disposition: A | Discharge status: Home / Self Care | Payer: Medicaid Other | Source: Ambulatory Visit | Attending: Obstetrics & Gynecology | Admitting: Obstetrics & Gynecology

## 2012-10-29 DIAGNOSIS — Z3689 Encounter for other specified antenatal screening: Secondary | ICD-10-CM | POA: Insufficient documentation

## 2012-10-29 DIAGNOSIS — O3680X0 Pregnancy with inconclusive fetal viability, not applicable or unspecified: Secondary | ICD-10-CM | POA: Insufficient documentation

## 2012-10-29 LAB — HEMOGLOBINOPATHY EVALUATION
Hemoglobin Other: 0 %
Hgb F Quant: 0 % (ref 0.0–2.0)
Hgb S Quant: 0 %

## 2012-11-13 ENCOUNTER — Emergency Department (HOSPITAL_COMMUNITY)
Admission: EM | Admit: 2012-11-13 | Discharge: 2012-11-13 | Disposition: A | Discharge status: Home / Self Care | Payer: Medicaid Other | Attending: Emergency Medicine | Admitting: Emergency Medicine

## 2012-11-13 ENCOUNTER — Encounter (HOSPITAL_COMMUNITY): Payer: Self-pay | Admitting: *Deleted

## 2012-11-13 ENCOUNTER — Emergency Department (HOSPITAL_COMMUNITY): Payer: Medicaid Other

## 2012-11-13 DIAGNOSIS — Y929 Unspecified place or not applicable: Secondary | ICD-10-CM | POA: Insufficient documentation

## 2012-11-13 DIAGNOSIS — S9000XA Contusion of unspecified ankle, initial encounter: Secondary | ICD-10-CM | POA: Insufficient documentation

## 2012-11-13 DIAGNOSIS — O9989 Other specified diseases and conditions complicating pregnancy, childbirth and the puerperium: Secondary | ICD-10-CM | POA: Insufficient documentation

## 2012-11-13 DIAGNOSIS — Z87828 Personal history of other (healed) physical injury and trauma: Secondary | ICD-10-CM | POA: Insufficient documentation

## 2012-11-13 DIAGNOSIS — Y939 Activity, unspecified: Secondary | ICD-10-CM | POA: Insufficient documentation

## 2012-11-13 DIAGNOSIS — S93409A Sprain of unspecified ligament of unspecified ankle, initial encounter: Secondary | ICD-10-CM | POA: Insufficient documentation

## 2012-11-13 DIAGNOSIS — X500XXA Overexertion from strenuous movement or load, initial encounter: Secondary | ICD-10-CM | POA: Insufficient documentation

## 2012-11-13 DIAGNOSIS — S93401A Sprain of unspecified ligament of right ankle, initial encounter: Secondary | ICD-10-CM

## 2012-11-13 NOTE — Progress Notes (Signed)
Orthopedic Tech Progress Note Patient Details:  [name] [birth year] [id number]  Ortho Devices Type of Ortho Device: ASO;Crutches Ortho Device/Splint Location: right ankle Ortho Device/Splint Interventions: Application   [name] 11/13/2012, 2:21 PM

## 2012-11-13 NOTE — ED Notes (Signed)
To ED for eval of right ankle pain after twisting ankle last night. Ambulatory. Bruising noted. Good CMS. Pt is 9 wks preg

## 2012-11-13 NOTE — ED Provider Notes (Signed)
Medical screening examination/treatment/procedure(s) were performed by non-physician practitioner and as supervising physician I was immediately available for consultation/collaboration.   Carleene Cooper III, MD 11/13/12 2135

## 2012-11-13 NOTE — ED Provider Notes (Signed)
History     CSN: [id number]  Arrival date & time 11/13/12  1235   First MD Initiated Contact with Patient 11/13/12 1241      Chief Complaint  Patient presents with  . Ankle Pain    (Consider location/radiation/quality/duration/timing/severity/associated sxs/prior treatment) HPI Comments: Patient is a 22 year old female who presents with sudden onset of right ankle pain since last night. The mechanism of injury was sudden ankle inversion. Patient reports hearing a "pop" sudden onset of throbbing, severe pain that is localized to right ankle. Patient reports progressive worsening of pain. Ankle movement and weight bearing activity make the pain worse. Nothing makes the pain better. Patient reports associated swelling and bruising. Patient has not tried anything for pain relief. Patient denies obvious deformity, numbness/tingling, coolness/weakness of extremity, and any other injury.      Past Medical History  Diagnosis Date  . Tendon injury     L hand    Past Surgical History  Procedure Laterality Date  . Hand surgery      08/2011- artery & tendon repair- post trauma   . Tendon repair  03/25/2012    Procedure: TENDON REPAIR;  Surgeon: Sharma Covert, MD;  Location: [facility] OR;  Service: Orthopedics;  Laterality: Left;  left Index Finger Tenolysis     History reviewed. No pertinent family history.  History  Substance Use Topics  . Smoking status: Never Smoker   . Smokeless tobacco: Not on file  . Alcohol Use: No    OB History   Grav Para Term Preterm Abortions TAB SAB Ect Mult Living   1               Review of Systems  Musculoskeletal: Positive for arthralgias.  All other systems reviewed and are negative.    Allergies  Review of patient's allergies indicates no known allergies.  Home Medications  No current outpatient prescriptions on file.  BP 114/61  Pulse 78  Temp(Src) 97.7 F (36.5 C) (Oral)  Resp 18  SpO2 99%  LMP 09/09/2012  Physical Exam  Nursing  note and vitals reviewed. Constitutional: She is oriented to person, place, and time. She appears well-developed and well-nourished. No distress.  HENT:  Head: Normocephalic and atraumatic.  Eyes: Conjunctivae are normal.  Neck: Normal range of motion. Neck supple.  Cardiovascular: Normal rate, regular rhythm and intact distal pulses.  Exam reveals no gallop and no friction rub.   No murmur heard. Pulmonary/Chest: Effort normal and breath sounds normal. She has no wheezes. She has no rales. She exhibits no tenderness.  Abdominal: Soft. There is no tenderness.  Musculoskeletal: Normal range of motion.  Right lateral, dorsal foot bruising and tenderness to palpation over 4th and 5th metatarsal. No obvious deformity. Right ankle ROM intact.   Neurological: She is alert and oriented to person, place, and time. Coordination normal.  Speech is goal-oriented. Moves limbs without ataxia.   Skin: Skin is warm and dry.  Psychiatric: She has a normal mood and affect. Her behavior is normal.    ED Course  Procedures (including critical care time)  Labs Reviewed - No data to display Dg Foot Complete Right  11/13/2012  *RADIOLOGY REPORT*  Clinical Data: Twisting injury, lateral pain and bruising  RIGHT FOOT COMPLETE - 3+ VIEW  Comparison: None.  Findings: Normal alignment without fracture or acute osseous abnormality.  Preserved joint spaces.  No significant arthropathy or degenerative process.  No definite soft tissue abnormality. Slight swelling laterally.  IMPRESSION: No acute  osseous finding   Original Report Authenticated By: Judie Petit. Miles Costain, M.D.      No diagnosis found.    MDM  2:00 PM Foot xray unremarkable for fracture. Patient will have ASO brace and crutches for comfort. Patient instructed to bear weight as tolerated. Patient declines pain medication. No neurovascular compromise. No further evaluation needed at this time.        Emilia Beck, PA-C 11/13/12 1405

## 2012-11-21 ENCOUNTER — Encounter: Payer: Self-pay | Admitting: *Deleted

## 2012-11-21 ENCOUNTER — Ambulatory Visit (INDEPENDENT_AMBULATORY_CARE_PROVIDER_SITE_OTHER): Payer: Medicaid Other | Admitting: Advanced Practice Midwife

## 2012-11-21 ENCOUNTER — Encounter: Payer: Self-pay | Admitting: Advanced Practice Midwife

## 2012-11-21 ENCOUNTER — Other Ambulatory Visit: Payer: Self-pay | Admitting: Family Medicine

## 2012-11-21 VITALS — BP 117/71 | Temp 97.2°F | Wt 113.1 lb

## 2012-11-21 DIAGNOSIS — Z3400 Encounter for supervision of normal first pregnancy, unspecified trimester: Secondary | ICD-10-CM

## 2012-11-21 LAB — POCT URINALYSIS DIP (DEVICE)
Bilirubin Urine: NEGATIVE
Nitrite: NEGATIVE
Protein, ur: NEGATIVE mg/dL
pH: 7 (ref 5.0–8.0)

## 2012-11-21 NOTE — Progress Notes (Signed)
Pulse- 88  Pain-lower abd Pt stated that feel last week and went to ER.   New ob packet given Offer flu vaccine

## 2012-11-21 NOTE — Progress Notes (Signed)
   Subjective:    [name] is a G1P0 [redacted]w[redacted]d being seen today for her first obstetrical visit.  Her obstetrical history is significant for primipara, no significant health problems. Patient does intend to breast feed. Pregnancy history fully reviewed.  Patient reports nausea and vomiting.  Filed Vitals:   11/21/12 0829  BP: 117/71  Temp: 97.2 F (36.2 C)  Weight: 51.302 kg (113 lb 1.6 oz)    HISTORY: OB History   Grav Para Term Preterm Abortions TAB SAB Ect Mult Living   1              # Outc Date GA Lbr Len/2nd Wgt Sex Del Anes PTL Lv   1 CUR              Past Medical History  Diagnosis Date  . Tendon injury     L hand   Past Surgical History  Procedure Laterality Date  . Hand surgery      08/2011- artery & tendon repair- post trauma   . Tendon repair  03/25/2012    Procedure: TENDON REPAIR;  Surgeon: Sharma Covert, MD;  Location: [facility] OR;  Service: Orthopedics;  Laterality: Left;  left Index Finger Tenolysis    No family history on file.   Exam    Uterus:     Pelvic Exam:    Perineum: Not evaluated   Vulva: Not evaluated   Vagina:  Not evaluated   pH:    Cervix: Not evaluated   Adnexa: not evaluated   Bony Pelvis: average  System: Breast:  normal appearance, no masses or tenderness   Skin: normal coloration and turgor, no rashes    Neurologic: oriented, normal mood   Extremities: normal strength, tone, and muscle mass, ROM of all joints is normal   HEENT neck supple with midline trachea and thyroid without masses   Mouth/Teeth mucous membranes moist, pharynx normal without lesions   Neck supple and no masses   Cardiovascular: regular rate and rhythm   Respiratory:  appears well, vitals normal, no respiratory distress, acyanotic, normal RR, ear and throat exam is normal, neck free of mass or lymphadenopathy, chest clear, no wheezing, crepitations, rhonchi, normal symmetric air entry   Abdomen: soft, non-tender; bowel sounds normal; no masses,  no organomegaly    Urinary: Not evaluated      Assessment:    Pregnancy: G1P0 There is no problem list on file for this patient.       Plan:     Initial labs drawn. Prenatal vitamins. Problem list reviewed and updated. Genetic Screening discussed First Screen, Integrated Screen and Quad Screen: ordered.  Ultrasound discussed; fetal survey: requested.  Follow up in 3 weeks. 50% of 30 min visit spent on counseling and coordination of care.     LEFTWICH-KIRBY, LISA 11/21/2012

## 2012-11-21 NOTE — Progress Notes (Signed)
Nutrition note: 1st visit consult Pt was underwt prior to pregnancy. Pt has gained 3.1# @ [redacted]w[redacted]d, which is wnl. Pt reports eating 3 meals & 1 snack/d, which are high in starch (rice, noodles). Pt is not taking a PNV yet. Pt reports having N&V but no heartburn. NKFA. Pt received verbal & written education on general nutrition during pregnancy. Encouraged pt to include a protein source with all meals & snacks. Disc importance of PNV. Provided pt with handout on N&V and encouraged small freq. meals. Disc wt gain goals of 28-40# total or 1#/wk in 2nd & 3rd trimester. Pt agrees to start taking a PNV & include a protein source with all meals & snacks. Pt has WIC & plans to BF. F/u if referred Blondell Reveal, MS, RD, LDN

## 2012-11-22 ENCOUNTER — Encounter (HOSPITAL_COMMUNITY): Payer: Self-pay | Admitting: Advanced Practice Midwife

## 2012-11-22 LAB — CULTURE, OB URINE: Organism ID, Bacteria: NO GROWTH

## 2012-11-26 ENCOUNTER — Encounter: Payer: Self-pay | Admitting: *Deleted

## 2012-11-26 ENCOUNTER — Encounter (HOSPITAL_COMMUNITY): Payer: Self-pay | Admitting: Advanced Practice Midwife

## 2012-12-05 ENCOUNTER — Ambulatory Visit (HOSPITAL_COMMUNITY)
Admission: RE | Admit: 2012-12-05 | Discharge: 2012-12-05 | Disposition: A | Discharge status: Home / Self Care | Payer: Medicaid Other | Source: Ambulatory Visit | Attending: Obstetrics & Gynecology | Admitting: Obstetrics & Gynecology

## 2012-12-05 ENCOUNTER — Ambulatory Visit (HOSPITAL_COMMUNITY)
Admission: RE | Admit: 2012-12-05 | Discharge: 2012-12-05 | Disposition: A | Discharge status: Home / Self Care | Payer: Medicaid Other | Source: Ambulatory Visit | Attending: Advanced Practice Midwife | Admitting: Advanced Practice Midwife

## 2012-12-05 ENCOUNTER — Other Ambulatory Visit: Payer: Self-pay

## 2012-12-05 DIAGNOSIS — O351XX0 Maternal care for (suspected) chromosomal abnormality in fetus, not applicable or unspecified: Secondary | ICD-10-CM | POA: Insufficient documentation

## 2012-12-05 DIAGNOSIS — Z3689 Encounter for other specified antenatal screening: Secondary | ICD-10-CM | POA: Insufficient documentation

## 2012-12-05 DIAGNOSIS — O3510X0 Maternal care for (suspected) chromosomal abnormality in fetus, unspecified, not applicable or unspecified: Secondary | ICD-10-CM | POA: Insufficient documentation

## 2012-12-05 NOTE — Progress Notes (Signed)
[name] was seen for ultrasound appointment today.  Please see [facility] report for details.

## 2012-12-12 ENCOUNTER — Ambulatory Visit (INDEPENDENT_AMBULATORY_CARE_PROVIDER_SITE_OTHER): Payer: Medicaid Other | Admitting: Obstetrics & Gynecology

## 2012-12-12 ENCOUNTER — Other Ambulatory Visit: Payer: Self-pay | Admitting: Obstetrics & Gynecology

## 2012-12-12 VITALS — BP 118/68 | Temp 98.1°F | Wt 115.0 lb

## 2012-12-12 DIAGNOSIS — Z3400 Encounter for supervision of normal first pregnancy, unspecified trimester: Secondary | ICD-10-CM

## 2012-12-12 LAB — POCT URINALYSIS DIP (DEVICE)
Ketones, ur: NEGATIVE mg/dL
Leukocytes, UA: NEGATIVE
Protein, ur: NEGATIVE mg/dL
Specific Gravity, Urine: 1.015 (ref 1.005–1.030)
Urobilinogen, UA: 0.2 mg/dL (ref 0.0–1.0)
pH: 6 (ref 5.0–8.0)

## 2012-12-12 NOTE — Progress Notes (Signed)
Anatomy scan ordered and scheduled.  MSAFP lab draw next visit.  No other complaints or concerns.  Routine obstetric precautions reviewed.

## 2012-12-12 NOTE — Patient Instructions (Signed)
Return to clinic for any obstetric concerns or go to [facility] for evaluation  

## 2012-12-12 NOTE — Progress Notes (Signed)
Pulse: 72

## 2012-12-17 ENCOUNTER — Ambulatory Visit (HOSPITAL_COMMUNITY): Payer: Medicaid Other

## 2013-01-09 ENCOUNTER — Ambulatory Visit (INDEPENDENT_AMBULATORY_CARE_PROVIDER_SITE_OTHER): Payer: Medicaid Other | Admitting: Obstetrics & Gynecology

## 2013-01-09 ENCOUNTER — Encounter: Payer: Self-pay | Admitting: Obstetrics & Gynecology

## 2013-01-09 VITALS — BP 115/61 | Temp 98.0°F | Wt 117.0 lb

## 2013-01-09 DIAGNOSIS — Z3400 Encounter for supervision of normal first pregnancy, unspecified trimester: Secondary | ICD-10-CM

## 2013-01-09 DIAGNOSIS — Z3402 Encounter for supervision of normal first pregnancy, second trimester: Secondary | ICD-10-CM

## 2013-01-09 LAB — POCT URINALYSIS DIP (DEVICE)
Glucose, UA: NEGATIVE mg/dL
Leukocytes, UA: NEGATIVE
Nitrite: NEGATIVE
Urobilinogen, UA: 0.2 mg/dL (ref 0.0–1.0)
pH: 6 (ref 5.0–8.0)

## 2013-01-09 NOTE — Patient Instructions (Signed)
Pregnancy - Second Trimester The second trimester of pregnancy (3 to 6 months) is a period of rapid growth for you and your baby. At the end of the sixth month, your baby is about 9 inches long and weighs 1 1/2 pounds. You will begin to feel the baby move between 18 and 20 weeks of the pregnancy. This is called quickening. Weight gain is faster. A clear fluid (colostrum) may leak out of your breasts. You may feel small contractions of the womb (uterus). This is known as false labor or Braxton-Hicks contractions. This is like a practice for labor when the baby is ready to be born. Usually, the problems with morning sickness have usually passed by the end of your first trimester. Some women develop small dark blotches (called cholasma, mask of pregnancy) on their face that usually goes away after the baby is born. Exposure to the sun makes the blotches worse. Acne may also develop in some pregnant women and pregnant women who have acne, may find that it goes away. PRENATAL EXAMS  Blood work may continue to be done during prenatal exams. These tests are done to check on your health and the probable health of your baby. Blood work is used to follow your blood levels (hemoglobin). Anemia (low hemoglobin) is common during pregnancy. Iron and vitamins are given to help prevent this. You will also be checked for diabetes between 24 and 28 weeks of the pregnancy. Some of the previous blood tests may be repeated.  The size of the uterus is measured during each visit. This is to make sure that the baby is continuing to grow properly according to the dates of the pregnancy.  Your blood pressure is checked every prenatal visit. This is to make sure you are not getting toxemia.  Your urine is checked to make sure you do not have an infection, diabetes or protein in the urine.  Your weight is checked often to make sure gains are happening at the suggested rate. This is to ensure that both you and your baby are  growing normally.  Sometimes, an ultrasound is performed to confirm the proper growth and development of the baby. This is a test which bounces harmless sound waves off the baby so your caregiver can more accurately determine due dates. Sometimes, a test is done on the amniotic fluid surrounding the baby. This test is called an amniocentesis. The amniotic fluid is obtained by sticking a needle into the belly (abdomen). This is done to check the chromosomes in instances where there is a concern about possible genetic problems with the baby. It is also sometimes done near the end of pregnancy if an early delivery is required. In this case, it is done to help make sure the baby's lungs are mature enough for the baby to live outside of the womb. CHANGES OCCURING IN THE SECOND TRIMESTER OF PREGNANCY Your body goes through many changes during pregnancy. They vary from person to person. Talk to your caregiver about changes you notice that you are concerned about.  During the second trimester, you will likely have an increase in your appetite. It is normal to have cravings for certain foods. This varies from person to person and pregnancy to pregnancy.  Your lower abdomen will begin to bulge.  You may have to urinate more often because the uterus and baby are pressing on your bladder. It is also common to get more bladder infections during pregnancy. You can help this by drinking lots of fluids   and emptying your bladder before and after intercourse.  You may begin to get stretch marks on your hips, abdomen, and breasts. These are normal changes in the body during pregnancy. There are no exercises or medicines to take that prevent this change.  You may begin to develop swollen and bulging veins (varicose veins) in your legs. Wearing support hose, elevating your feet for 15 minutes, 3 to 4 times a day and limiting salt in your diet helps lessen the problem.  Heartburn may develop as the uterus grows and  pushes up against the stomach. Antacids recommended by your caregiver helps with this problem. Also, eating smaller meals 4 to 5 times a day helps.  Constipation can be treated with a stool softener or adding bulk to your diet. Drinking lots of fluids, and eating vegetables, fruits, and whole grains are helpful.  Exercising is also helpful. If you have been very active up until your pregnancy, most of these activities can be continued during your pregnancy. If you have been less active, it is helpful to start an exercise program such as walking.  Hemorrhoids may develop at the end of the second trimester. Warm sitz baths and hemorrhoid cream recommended by your caregiver helps hemorrhoid problems.  Backaches may develop during this time of your pregnancy. Avoid heavy lifting, wear low heal shoes, and practice good posture to help with backache problems.  Some pregnant women develop tingling and numbness of their hand and fingers because of swelling and tightening of ligaments in the wrist (carpel tunnel syndrome). This goes away after the baby is born.  As your breasts enlarge, you may have to get a bigger bra. Get a comfortable, cotton, support bra. Do not get a nursing bra until the last month of the pregnancy if you will be nursing the baby.  You may get a dark line from your belly button to the pubic area called the linea nigra.  You may develop rosy cheeks because of increase blood flow to the face.  You may develop spider looking lines of the face, neck, arms, and chest. These go away after the baby is born. HOME CARE INSTRUCTIONS   It is extremely important to avoid all smoking, herbs, alcohol, and unprescribed drugs during your pregnancy. These chemicals affect the formation and growth of the baby. Avoid these chemicals throughout the pregnancy to ensure the delivery of a healthy infant.  Most of your home care instructions are the same as suggested for the first trimester of your  pregnancy. Keep your caregiver's appointments. Follow your caregiver's instructions regarding medicine use, exercise, and diet.  During pregnancy, you are providing food for you and your baby. Continue to eat regular, well-balanced meals. Choose foods such as meat, fish, milk and other low fat dairy products, vegetables, fruits, and whole-grain breads and cereals. Your caregiver will tell you of the ideal weight gain.  A physical sexual relationship may be continued up until near the end of pregnancy if there are no other problems. Problems could include early (premature) leaking of amniotic fluid from the membranes, vaginal bleeding, abdominal pain, or other medical or pregnancy problems.  Exercise regularly if there are no restrictions. Check with your caregiver if you are unsure of the safety of some of your exercises. The greatest weight gain will occur in the last 2 trimesters of pregnancy. Exercise will help you:  Control your weight.  Get you in shape for labor and delivery.  Lose weight after you have the baby.  Wear   a good support or jogging bra for breast tenderness during pregnancy. This may help if worn during sleep. Pads or tissues may be used in the bra if you are leaking colostrum.  Do not use hot tubs, steam rooms or saunas throughout the pregnancy.  Wear your seat belt at all times when driving. This protects you and your baby if you are in an accident.  Avoid raw meat, uncooked cheese, cat litter boxes, and soil used by cats. These carry germs that can cause birth defects in the baby.  The second trimester is also a good time to visit your dentist for your dental health if this has not been done yet. Getting your teeth cleaned is okay. Use a soft toothbrush. Brush gently during pregnancy.  It is easier to leak urine during pregnancy. Tightening up and strengthening the pelvic muscles will help with this problem. Practice stopping your urination while you are going to the  bathroom. These are the same muscles you need to strengthen. It is also the muscles you would use as if you were trying to stop from passing gas. You can practice tightening these muscles up 10 times a set and repeating this about 3 times per day. Once you know what muscles to tighten up, do not perform these exercises during urination. It is more likely to contribute to an infection by backing up the urine.  Ask for help if you have financial, counseling, or nutritional needs during pregnancy. Your caregiver will be able to offer counseling for these needs as well as refer you for other special needs.  Your skin may become oily. If so, wash your face with mild soap, use non-greasy moisturizer and oil or cream based makeup. MEDICINES AND DRUG USE IN PREGNANCY  Take prenatal vitamins as directed. The vitamin should contain 1 milligram of folic acid. Keep all vitamins out of reach of children. Only a couple vitamins or tablets containing iron may be fatal to a baby or young child when ingested.  Avoid use of all medicines, including herbs, over-the-counter medicines, not prescribed or suggested by your caregiver. Only take over-the-counter or prescription medicines for pain, discomfort, or fever as directed by your caregiver. Do not use aspirin.  Let your caregiver also know about herbs you may be using.  Alcohol is related to a number of birth defects. This includes fetal alcohol syndrome. All alcohol, in any form, should be avoided completely. Smoking will cause low birth rate and premature babies.  Street or illegal drugs are very harmful to the baby. They are absolutely forbidden. A baby born to an addicted mother will be addicted at birth. The baby will go through the same withdrawal an adult does. SEEK MEDICAL CARE IF:  You have any concerns or worries during your pregnancy. It is better to call with your questions if you feel they cannot wait, rather than worry about them. SEEK IMMEDIATE  MEDICAL CARE IF:   An unexplained oral temperature above 102 F (38.9 C) develops, or as your caregiver suggests.  You have leaking of fluid from the vagina (birth canal). If leaking membranes are suspected, take your temperature and tell your caregiver of this when you call.  There is vaginal spotting, bleeding, or passing clots. Tell your caregiver of the amount and how many pads are used. Light spotting in pregnancy is common, especially following intercourse.  You develop a bad smelling vaginal discharge with a change in the color from clear to white.  You continue to feel   sick to your stomach (nauseated) and have no relief from remedies suggested. You vomit blood or coffee ground-like materials.  You lose more than 2 pounds of weight or gain more than 2 pounds of weight over 1 week, or as suggested by your caregiver.  You notice swelling of your face, hands, feet, or legs.  You get exposed to German measles and have never had them.  You are exposed to fifth disease or chickenpox.  You develop belly (abdominal) pain. Round ligament discomfort is a common non-cancerous (benign) cause of abdominal pain in pregnancy. Your caregiver still must evaluate you.  You develop a bad headache that does not go away.  You develop fever, diarrhea, pain with urination, or shortness of breath.  You develop visual problems, blurry, or double vision.  You fall or are in a car accident or any kind of trauma.  There is mental or physical violence at home. Document Released: 08/01/2001 Document Revised: 05/01/2012 Document Reviewed: 02/03/2009 [employer] Patient Information 2014 [employer].  

## 2013-01-09 NOTE — Progress Notes (Signed)
Quad screen today. Had nl NT, is scheduled for 18 week [place]. Slight nosebleed noted 2 days ago

## 2013-01-09 NOTE — Progress Notes (Signed)
P=87. C/o nose and gums bleeding every 2 days or so- states wakes up with nose bleed. And gums bleed often when she brushes her teeth. Also c/o cramping feeling in lower to mid abdomen about once a day. States is on her feet a lot , works in [employer]

## 2013-01-16 ENCOUNTER — Ambulatory Visit (HOSPITAL_COMMUNITY)
Admission: RE | Admit: 2013-01-16 | Discharge: 2013-01-16 | Disposition: A | Discharge status: Home / Self Care | Payer: Medicaid Other | Source: Ambulatory Visit | Attending: Obstetrics & Gynecology | Admitting: Obstetrics & Gynecology

## 2013-01-16 DIAGNOSIS — Z363 Encounter for antenatal screening for malformations: Secondary | ICD-10-CM | POA: Insufficient documentation

## 2013-01-16 DIAGNOSIS — Z1389 Encounter for screening for other disorder: Secondary | ICD-10-CM | POA: Insufficient documentation

## 2013-01-16 DIAGNOSIS — O358XX0 Maternal care for other (suspected) fetal abnormality and damage, not applicable or unspecified: Secondary | ICD-10-CM | POA: Insufficient documentation

## 2013-01-16 DIAGNOSIS — Z3400 Encounter for supervision of normal first pregnancy, unspecified trimester: Secondary | ICD-10-CM

## 2013-01-17 ENCOUNTER — Encounter: Payer: Self-pay | Admitting: Obstetrics & Gynecology

## 2013-01-17 DIAGNOSIS — O09899 Supervision of other high risk pregnancies, unspecified trimester: Secondary | ICD-10-CM | POA: Insufficient documentation

## 2013-01-21 ENCOUNTER — Encounter: Payer: Self-pay | Admitting: *Deleted

## 2013-02-06 ENCOUNTER — Ambulatory Visit (INDEPENDENT_AMBULATORY_CARE_PROVIDER_SITE_OTHER): Payer: Medicaid Other | Admitting: Family

## 2013-02-06 DIAGNOSIS — IMO0001 Reserved for inherently not codable concepts without codable children: Secondary | ICD-10-CM

## 2013-02-06 LAB — POCT URINALYSIS DIP (DEVICE)
Leukocytes, UA: NEGATIVE
Nitrite: NEGATIVE
Protein, ur: NEGATIVE mg/dL
Urobilinogen, UA: 0.2 mg/dL (ref 0.0–1.0)
pH: 6.5 (ref 5.0–8.0)

## 2013-02-06 LAB — CBC
MCHC: 33.5 g/dL (ref 30.0–36.0)
Platelets: 283 10*3/uL (ref 150–400)
RDW: 13.9 % (ref 11.5–15.5)

## 2013-02-06 NOTE — Progress Notes (Signed)
P=81. States nose and gums still bleeding every 2 days.

## 2013-02-06 NOTE — Progress Notes (Signed)
Pt complains of gums and nose bleeding every other day. Denies vaginal bleeding. No other complaints or questions.

## 2013-02-06 NOTE — Progress Notes (Signed)
Marginal previa (1 cm); please refrain from sex until recheck of previa on ultrasound ([facility]); nose and gums bleeding > check CBC, last dental exam > 1 yr > schedule appt for eval

## 2013-02-27 ENCOUNTER — Ambulatory Visit (INDEPENDENT_AMBULATORY_CARE_PROVIDER_SITE_OTHER): Payer: Medicaid Other | Admitting: Family Medicine

## 2013-02-27 DIAGNOSIS — Z3402 Encounter for supervision of normal first pregnancy, second trimester: Secondary | ICD-10-CM

## 2013-02-27 DIAGNOSIS — O4400 Placenta previa specified as without hemorrhage, unspecified trimester: Secondary | ICD-10-CM

## 2013-02-27 LAB — POCT URINALYSIS DIP (DEVICE)
Bilirubin Urine: NEGATIVE
Glucose, UA: NEGATIVE mg/dL
Ketones, ur: NEGATIVE mg/dL
Leukocytes, UA: NEGATIVE
Nitrite: NEGATIVE
Specific Gravity, Urine: 1.015 (ref 1.005–1.030)
pH: 7 (ref 5.0–8.0)

## 2013-02-27 NOTE — Patient Instructions (Signed)
Pregnancy - Second Trimester The second trimester of pregnancy (3 to 6 months) is a period of rapid growth for you and your baby. At the end of the sixth month, your baby is about 9 inches long and weighs 1 1/2 pounds. You will begin to feel the baby move between 18 and 20 weeks of the pregnancy. This is called quickening. Weight gain is faster. A clear fluid (colostrum) may leak out of your breasts. You may feel small contractions of the womb (uterus). This is known as false labor or Braxton-Hicks contractions. This is like a practice for labor when the baby is ready to be born. Usually, the problems with morning sickness have usually passed by the end of your first trimester. Some women develop small dark blotches (called cholasma, mask of pregnancy) on their face that usually goes away after the baby is born. Exposure to the sun makes the blotches worse. Acne may also develop in some pregnant women and pregnant women who have acne, may find that it goes away. PRENATAL EXAMS  Blood work may continue to be done during prenatal exams. These tests are done to check on your health and the probable health of your baby. Blood work is used to follow your blood levels (hemoglobin). Anemia (low hemoglobin) is common during pregnancy. Iron and vitamins are given to help prevent this. You will also be checked for diabetes between 24 and 28 weeks of the pregnancy. Some of the previous blood tests may be repeated.  The size of the uterus is measured during each visit. This is to make sure that the baby is continuing to grow properly according to the dates of the pregnancy.  Your blood pressure is checked every prenatal visit. This is to make sure you are not getting toxemia.  Your urine is checked to make sure you do not have an infection, diabetes or protein in the urine.  Your weight is checked often to make sure gains are happening at the suggested rate. This is to ensure that both you and your baby are  growing normally.  Sometimes, an ultrasound is performed to confirm the proper growth and development of the baby. This is a test which bounces harmless sound waves off the baby so your caregiver can more accurately determine due dates. Sometimes, a test is done on the amniotic fluid surrounding the baby. This test is called an amniocentesis. The amniotic fluid is obtained by sticking a needle into the belly (abdomen). This is done to check the chromosomes in instances where there is a concern about possible genetic problems with the baby. It is also sometimes done near the end of pregnancy if an early delivery is required. In this case, it is done to help make sure the baby's lungs are mature enough for the baby to live outside of the womb. CHANGES OCCURING IN THE SECOND TRIMESTER OF PREGNANCY Your body goes through many changes during pregnancy. They vary from person to person. Talk to your caregiver about changes you notice that you are concerned about.  During the second trimester, you will likely have an increase in your appetite. It is normal to have cravings for certain foods. This varies from person to person and pregnancy to pregnancy.  Your lower abdomen will begin to bulge.  You may have to urinate more often because the uterus and baby are pressing on your bladder. It is also common to get more bladder infections during pregnancy. You can help this by drinking lots of fluids   and emptying your bladder before and after intercourse.  You may begin to get stretch marks on your hips, abdomen, and breasts. These are normal changes in the body during pregnancy. There are no exercises or medicines to take that prevent this change.  You may begin to develop swollen and bulging veins (varicose veins) in your legs. Wearing support hose, elevating your feet for 15 minutes, 3 to 4 times a day and limiting salt in your diet helps lessen the problem.  Heartburn may develop as the uterus grows and  pushes up against the stomach. Antacids recommended by your caregiver helps with this problem. Also, eating smaller meals 4 to 5 times a day helps.  Constipation can be treated with a stool softener or adding bulk to your diet. Drinking lots of fluids, and eating vegetables, fruits, and whole grains are helpful.  Exercising is also helpful. If you have been very active up until your pregnancy, most of these activities can be continued during your pregnancy. If you have been less active, it is helpful to start an exercise program such as walking.  Hemorrhoids may develop at the end of the second trimester. Warm sitz baths and hemorrhoid cream recommended by your caregiver helps hemorrhoid problems.  Backaches may develop during this time of your pregnancy. Avoid heavy lifting, wear low heal shoes, and practice good posture to help with backache problems.  Some pregnant women develop tingling and numbness of their hand and fingers because of swelling and tightening of ligaments in the wrist (carpel tunnel syndrome). This goes away after the baby is born.  As your breasts enlarge, you may have to get a bigger bra. Get a comfortable, cotton, support bra. Do not get a nursing bra until the last month of the pregnancy if you will be nursing the baby.  You may get a dark line from your belly button to the pubic area called the linea nigra.  You may develop rosy cheeks because of increase blood flow to the face.  You may develop spider looking lines of the face, neck, arms, and chest. These go away after the baby is born. HOME CARE INSTRUCTIONS   It is extremely important to avoid all smoking, herbs, alcohol, and unprescribed drugs during your pregnancy. These chemicals affect the formation and growth of the baby. Avoid these chemicals throughout the pregnancy to ensure the delivery of a healthy infant.  Most of your home care instructions are the same as suggested for the first trimester of your  pregnancy. Keep your caregiver's appointments. Follow your caregiver's instructions regarding medicine use, exercise, and diet.  During pregnancy, you are providing food for you and your baby. Continue to eat regular, well-balanced meals. Choose foods such as meat, fish, milk and other low fat dairy products, vegetables, fruits, and whole-grain breads and cereals. Your caregiver will tell you of the ideal weight gain.  A physical sexual relationship may be continued up until near the end of pregnancy if there are no other problems. Problems could include early (premature) leaking of amniotic fluid from the membranes, vaginal bleeding, abdominal pain, or other medical or pregnancy problems.  Exercise regularly if there are no restrictions. Check with your caregiver if you are unsure of the safety of some of your exercises. The greatest weight gain will occur in the last 2 trimesters of pregnancy. Exercise will help you:  Control your weight.  Get you in shape for labor and delivery.  Lose weight after you have the baby.  Wear   a good support or jogging bra for breast tenderness during pregnancy. This may help if worn during sleep. Pads or tissues may be used in the bra if you are leaking colostrum.  Do not use hot tubs, steam rooms or saunas throughout the pregnancy.  Wear your seat belt at all times when driving. This protects you and your baby if you are in an accident.  Avoid raw meat, uncooked cheese, cat litter boxes, and soil used by cats. These carry germs that can cause birth defects in the baby.  The second trimester is also a good time to visit your dentist for your dental health if this has not been done yet. Getting your teeth cleaned is okay. Use a soft toothbrush. Brush gently during pregnancy.  It is easier to leak urine during pregnancy. Tightening up and strengthening the pelvic muscles will help with this problem. Practice stopping your urination while you are going to the  bathroom. These are the same muscles you need to strengthen. It is also the muscles you would use as if you were trying to stop from passing gas. You can practice tightening these muscles up 10 times a set and repeating this about 3 times per day. Once you know what muscles to tighten up, do not perform these exercises during urination. It is more likely to contribute to an infection by backing up the urine.  Ask for help if you have financial, counseling, or nutritional needs during pregnancy. Your caregiver will be able to offer counseling for these needs as well as refer you for other special needs.  Your skin may become oily. If so, wash your face with mild soap, use non-greasy moisturizer and oil or cream based makeup. MEDICINES AND DRUG USE IN PREGNANCY  Take prenatal vitamins as directed. The vitamin should contain 1 milligram of folic acid. Keep all vitamins out of reach of children. Only a couple vitamins or tablets containing iron may be fatal to a baby or young child when ingested.  Avoid use of all medicines, including herbs, over-the-counter medicines, not prescribed or suggested by your caregiver. Only take over-the-counter or prescription medicines for pain, discomfort, or fever as directed by your caregiver. Do not use aspirin.  Let your caregiver also know about herbs you may be using.  Alcohol is related to a number of birth defects. This includes fetal alcohol syndrome. All alcohol, in any form, should be avoided completely. Smoking will cause low birth rate and premature babies.  Street or illegal drugs are very harmful to the baby. They are absolutely forbidden. A baby born to an addicted mother will be addicted at birth. The baby will go through the same withdrawal an adult does. SEEK MEDICAL CARE IF:  You have any concerns or worries during your pregnancy. It is better to call with your questions if you feel they cannot wait, rather than worry about them. SEEK IMMEDIATE  MEDICAL CARE IF:   An unexplained oral temperature above 102 F (38.9 C) develops, or as your caregiver suggests.  You have leaking of fluid from the vagina (birth canal). If leaking membranes are suspected, take your temperature and tell your caregiver of this when you call.  There is vaginal spotting, bleeding, or passing clots. Tell your caregiver of the amount and how many pads are used. Light spotting in pregnancy is common, especially following intercourse.  You develop a bad smelling vaginal discharge with a change in the color from clear to white.  You continue to feel   sick to your stomach (nauseated) and have no relief from remedies suggested. You vomit blood or coffee ground-like materials.  You lose more than 2 pounds of weight or gain more than 2 pounds of weight over 1 week, or as suggested by your caregiver.  You notice swelling of your face, hands, feet, or legs.  You get exposed to German measles and have never had them.  You are exposed to fifth disease or chickenpox.  You develop belly (abdominal) pain. Round ligament discomfort is a common non-cancerous (benign) cause of abdominal pain in pregnancy. Your caregiver still must evaluate you.  You develop a bad headache that does not go away.  You develop fever, diarrhea, pain with urination, or shortness of breath.  You develop visual problems, blurry, or double vision.  You fall or are in a car accident or any kind of trauma.  There is mental or physical violence at home. Document Released: 08/01/2001 Document Revised: 05/01/2012 Document Reviewed: 02/03/2009 [employer] Patient Information 2014 [employer].  Breastfeeding A change in hormones during your pregnancy causes growth of your breast tissue and an increase in number and size of milk ducts. The hormone prolactin allows proteins, sugars, and fats from your blood supply to make breast milk in your milk-producing glands. The hormone progesterone prevents  breast milk from being released before the birth of your baby. After the birth of your baby, your progesterone level decreases allowing breast milk to be released. Thoughts of your baby, as well as his or her sucking or crying, can stimulate the release of milk from the milk-producing glands. Deciding to breastfeed (nurse) is one of the best choices you can make for you and your baby. The information that follows gives a brief review of the benefits, as well as other important skills to know about breastfeeding. BENEFITS OF BREASTFEEDING For your baby  The first milk (colostrum) helps your baby's digestive system function better.   There are antibodies in your milk that help your baby fight off infections.   Your baby has a lower incidence of asthma, allergies, and sudden infant death syndrome (SIDS).   The nutrients in breast milk are better for your baby than infant formulas.  Breast milk improves your baby's brain development.   Your baby will have less gas, colic, and constipation.  Your baby is less likely to develop other conditions, such as childhood obesity, asthma, or diabetes mellitus. For you  Breastfeeding helps develop a very special bond between you and your baby.   Breastfeeding is convenient, always available at the correct temperature, and costs nothing.   Breastfeeding helps to burn calories and helps you lose the weight gained during pregnancy.   Breastfeeding makes your uterus contract back down to normal size faster and slows bleeding following delivery.   Breastfeeding mothers have a lower risk of developing osteoporosis or breast or ovarian cancer later in life.  BREASTFEEDING FREQUENCY  A healthy, full-term baby may breastfeed as often as every hour or space his or her feedings to every 3 hours. Breastfeeding frequency will vary from baby to baby.   Newborns should be fed no less than every 2 3 hours during the day and every 4 5 hours during the  night. You should breastfeed a minimum of 8 feedings in a 24 hour period.  Awaken your baby to breastfeed if it has been 3 4 hours since the last feeding.  Breastfeed when you feel the need to reduce the fullness of your breasts or when   your newborn shows signs of hunger. Signs that your baby may be hungry include:  Increased alertness or activity.  Stretching.  Movement of the head from side to side.  Movement of the head and opening of the mouth when the corner of the mouth or cheek is stroked (rooting).  Increased sucking sounds, smacking lips, cooing, sighing, or squeaking.  Hand-to-mouth movements.  Increased sucking of fingers or hands.  Fussing.  Intermittent crying.  Signs of extreme hunger will require calming and consoling before you try to feed your baby. Signs of extreme hunger may include:  Restlessness.  A loud, strong cry.  Screaming.  Frequent feeding will help you make more milk and will help prevent problems, such as sore nipples and engorgement of the breasts.  BREASTFEEDING   Whether lying down or sitting, be sure that the baby's abdomen is facing your abdomen.   Support your breast with 4 fingers under your breast and your thumb above your nipple. Make sure your fingers are well away from your nipple and your baby's mouth.   Stroke your baby's lips gently with your finger or nipple.   When your baby's mouth is open wide enough, place all of your nipple and as much of the colored area around your nipple (areola) as possible into your baby's mouth.  More areola should be visible above his or her upper lip than below his or her lower lip.  Your baby's tongue should be between his or her lower gum and your breast.  Ensure that your baby's mouth is correctly positioned around the nipple (latched). Your baby's lips should create a seal on your breast.  Signs that your baby has effectively latched onto your nipple include:  Tugging or sucking  without pain.  Swallowing heard between sucks.  Absent click or smacking sound.  Muscle movement above and in front of his or her ears with sucking.  Your baby must suck about 2 3 minutes in order to get your milk. Allow your baby to feed on each breast as long as he or she wants. Nurse your baby until he or she unlatches or falls asleep at the first breast, then offer the second breast.  Signs that your baby is full and satisfied include:  A gradual decrease in the number of sucks or complete cessation of sucking.  Falling asleep.  Extension or relaxation of his or her body.  Retention of a small amount of milk in his or her mouth.  Letting go of your breast by himself or herself.  Signs of effective breastfeeding in you include:  Breasts that have increased firmness, weight, and size prior to feeding.  Breasts that are softer after nursing.  Increased milk volume, as well as a change in milk consistency and color by the 5th day of breastfeeding.  Breast fullness relieved by breastfeeding.  Nipples are not sore, cracked, or bleeding.  If needed, break the suction by putting your finger into the corner of your baby's mouth and sliding your finger between his or her gums. Then, remove your breast from his or her mouth.  It is common for babies to spit up a small amount after a feeding.  Babies often swallow air during feeding. This can make babies fussy. Burping your baby between breasts can help with this.  Vitamin D supplements are recommended for babies who get only breast milk.  Avoid using a pacifier during your baby's first 4 6 weeks.  Avoid supplemental feedings of water, formula, or   juice in place of breastfeeding. Breast milk is all the food your baby needs. It is not necessary for your baby to have water or formula. Your breasts will make more milk if supplemental feedings are avoided during the early weeks. HOW TO TELL WHETHER YOUR BABY IS GETTING ENOUGH BREAST  MILK Wondering whether or not your baby is getting enough milk is a common concern among mothers. You can be assured that your baby is getting enough milk if:   Your baby is actively sucking and you hear swallowing.   Your baby seems relaxed and satisfied after a feeding.   Your baby nurses at least 8 12 times in a 24 hour time period.  During the first 3 5 days of age:  Your baby is wetting at least 3 5 diapers in a 24 hour period. The urine should be clear and pale yellow.  Your baby is having at least 3 4 stools in a 24 hour period. The stool should be soft and yellow.  At 5 7 days of age, your baby is having at least 3 6 stools in a 24 hour period. The stool should be seedy and yellow by 5 days of age.  Your baby has a weight loss less than 7 10% during the first 3 days of age.  Your baby does not lose weight after 3 7 days of age.  Your baby gains 4 7 ounces each week after he or she is 4 days of age.  Your baby gains weight by 5 days of age and is back to birth weight within 2 weeks. ENGORGEMENT In the first week after your baby is born, you may experience extremely full breasts (engorgement). When engorged, your breasts may feel heavy, warm, or tender to the touch. Engorgement peaks within 24 48 hours after delivery of your baby.  Engorgement may be reduced by:  Continuing to breastfeed.  Increasing the frequency of breastfeeding.  Taking warm showers or applying warm, moist heat to your breasts just before each feeding. This increases circulation and helps the milk flow.   Gently massaging your breast before and during the feedings. With your fingertips, massage from your chest wall towards your nipple in a circular motion.   Ensuring that your baby empties at least one breast at every feeding. It also helps to start the next feeding on the opposite breast.   Expressing breast milk by hand or by using a breast pump to empty the breasts if your baby is sleepy, or  not nursing well. You may also want to express milk if you are returning to work oryou feel you are getting engorged.  Ensuring your baby is latched on and positioned properly while breastfeeding. If you follow these suggestions, your engorgement should improve in 24 48 hours. If you are still experiencing difficulty, call your lactation consultant or caregiver.  CARING FOR YOURSELF Take care of your breasts.  Bathe or shower daily.   Avoid using soap on your nipples.   Wear a supportive bra. Avoid wearing underwire style bras.  Air dry your nipples for a 3 4minutes after each feeding.   Use only cotton bra pads to absorb breast milk leakage. Leaking of breast milk between feedings is normal.   Use only pure lanolin on your nipples after nursing. You do not need to wash it off before feeding your baby again. Another option is to express a few drops of breast milk and gently massage that milk into your nipples.  Continue   breast self-awareness checks. Take care of yourself.  Eat healthy foods. Alternate 3 meals with 3 snacks.  Avoid foods that you notice affect your baby in a bad way.  Drink milk, fruit juice, and water to satisfy your thirst (about 8 glasses a day).   Rest often, relax, and take your prenatal vitamins to prevent fatigue, stress, and anemia.  Avoid chewing and smoking tobacco.  Avoid alcohol and drug use.  Take over-the-counter and prescribed medicine only as directed by your caregiver or pharmacist. You should always check with your caregiver or pharmacist before taking any new medicine, vitamin, or herbal supplement.  Know that pregnancy is possible while breastfeeding. If desired, talk to your caregiver about family planning and safe birth control methods that may be used while breastfeeding. SEEK MEDICAL CARE IF:   You feel like you want to stop breastfeeding or have become frustrated with breastfeeding.  You have painful breasts or nipples.  Your  nipples are cracked or bleeding.  Your breasts are red, tender, or warm.  You have a swollen area on either breast.  You have a fever or chills.  You have nausea or vomiting.  You have drainage from your nipples.  Your breasts do not become full before feedings by the 5th day after delivery.  You feel sad and depressed.  Your baby is too sleepy to eat well.  Your baby is having trouble sleeping.   Your baby is wetting less than 3 diapers in a 24 hour period.  Your baby has less than 3 stools in a 24 hour period.  Your baby's skin or the white part of his or her eyes becomes more yellow.   Your baby is not gaining weight by 5 days of age. MAKE SURE YOU:   Understand these instructions.  Will watch your condition.  Will get help right away if you are not doing well or get worse. Document Released: 08/07/2005 Document Revised: 05/01/2012 Document Reviewed: 03/13/2012 [employer] Patient Information 2014 [employer].  

## 2013-02-27 NOTE — Progress Notes (Signed)
Pulse: 68

## 2013-02-27 NOTE — Progress Notes (Signed)
Schedule U/S for growth for 2VC and marginal previa. 28 wk labs next visit.

## 2013-03-26 ENCOUNTER — Encounter: Payer: Medicaid Other | Admitting: Advanced Practice Midwife

## 2013-03-31 ENCOUNTER — Ambulatory Visit (HOSPITAL_COMMUNITY)
Admission: RE | Admit: 2013-03-31 | Discharge: 2013-03-31 | Disposition: A | Discharge status: Home / Self Care | Payer: Medicaid Other | Source: Ambulatory Visit | Attending: Family Medicine | Admitting: Family Medicine

## 2013-03-31 DIAGNOSIS — O4400 Placenta previa specified as without hemorrhage, unspecified trimester: Secondary | ICD-10-CM | POA: Insufficient documentation

## 2013-03-31 DIAGNOSIS — Z3689 Encounter for other specified antenatal screening: Secondary | ICD-10-CM | POA: Insufficient documentation

## 2013-04-01 ENCOUNTER — Encounter: Payer: Self-pay | Admitting: Family Medicine

## 2013-04-02 ENCOUNTER — Ambulatory Visit (INDEPENDENT_AMBULATORY_CARE_PROVIDER_SITE_OTHER): Payer: Medicaid Other | Admitting: Advanced Practice Midwife

## 2013-04-02 ENCOUNTER — Encounter: Payer: Self-pay | Admitting: Advanced Practice Midwife

## 2013-04-02 DIAGNOSIS — O4400 Placenta previa specified as without hemorrhage, unspecified trimester: Secondary | ICD-10-CM

## 2013-04-02 LAB — POCT URINALYSIS DIP (DEVICE)
Bilirubin Urine: NEGATIVE
Ketones, ur: NEGATIVE mg/dL
Protein, ur: NEGATIVE mg/dL
Specific Gravity, Urine: 1.02 (ref 1.005–1.030)
pH: 7 (ref 5.0–8.0)

## 2013-04-02 LAB — CBC
HCT: 35.3 % — ABNORMAL LOW (ref 36.0–46.0)
MCH: 32.1 pg (ref 26.0–34.0)
MCHC: 33.1 g/dL (ref 30.0–36.0)
MCV: 97 fL (ref 78.0–100.0)
RDW: 12.7 % (ref 11.5–15.5)

## 2013-04-02 NOTE — Progress Notes (Signed)
Pulse: 84 28 week labs today. 1hr due at 1017

## 2013-04-02 NOTE — Progress Notes (Signed)
Doing well.  Good fetal movement, denies vaginal bleeding, LOF, regular contractions.  

## 2013-04-07 ENCOUNTER — Encounter: Payer: Self-pay | Admitting: Advanced Practice Midwife

## 2013-04-07 ENCOUNTER — Telehealth: Payer: Self-pay

## 2013-04-07 NOTE — Telephone Encounter (Signed)
Called pt and informed her of the abnormal 1 hr gtt test and the need for a 3 hr test. I informed pt to come in fasting and that she would need to be here for the duration of test.  Pt stated that understanding and that she will be here on Thursday August 21st @ 0800.

## 2013-04-07 NOTE — Telephone Encounter (Signed)
Message copied by Faythe Casa on Mon Apr 07, 2013  4:43 PM ------      Message from: LEFTWICH-KIRBY, LISA A      Created: Mon Apr 07, 2013  8:46 AM       Pt failed 1 hour GTT (164) and needs 3 hour test scheduled.  Thank you. ------

## 2013-04-10 ENCOUNTER — Other Ambulatory Visit: Payer: Medicaid Other

## 2013-04-10 DIAGNOSIS — R7309 Other abnormal glucose: Secondary | ICD-10-CM

## 2013-04-11 LAB — GLUCOSE TOLERANCE, 3 HOURS
Glucose Tolerance, 2 hour: 134 mg/dL (ref 70–164)
Glucose, GTT - 3 Hour: 100 mg/dL (ref 70–144)

## 2013-04-16 ENCOUNTER — Ambulatory Visit (INDEPENDENT_AMBULATORY_CARE_PROVIDER_SITE_OTHER): Payer: Medicaid Other | Admitting: Advanced Practice Midwife

## 2013-04-16 DIAGNOSIS — IMO0001 Reserved for inherently not codable concepts without codable children: Secondary | ICD-10-CM

## 2013-04-16 LAB — POCT URINALYSIS DIP (DEVICE)
Glucose, UA: NEGATIVE mg/dL
Ketones, ur: NEGATIVE mg/dL
Leukocytes, UA: NEGATIVE
Specific Gravity, Urine: 1.025 (ref 1.005–1.030)
Urobilinogen, UA: 0.2 mg/dL (ref 0.0–1.0)

## 2013-04-16 NOTE — Progress Notes (Signed)
Doing well.  Good fetal movement, denies vaginal bleeding, LOF, regular contractions. Repeat U/S for growth at 34 weeks r/t 2v cord.

## 2013-04-16 NOTE — Progress Notes (Signed)
Pulse: 102

## 2013-04-30 ENCOUNTER — Ambulatory Visit (INDEPENDENT_AMBULATORY_CARE_PROVIDER_SITE_OTHER): Payer: Medicaid Other | Admitting: Family Medicine

## 2013-04-30 ENCOUNTER — Encounter: Payer: Self-pay | Admitting: Family Medicine

## 2013-04-30 DIAGNOSIS — Z23 Encounter for immunization: Secondary | ICD-10-CM

## 2013-04-30 DIAGNOSIS — Z3403 Encounter for supervision of normal first pregnancy, third trimester: Secondary | ICD-10-CM

## 2013-04-30 DIAGNOSIS — O4400 Placenta previa specified as without hemorrhage, unspecified trimester: Secondary | ICD-10-CM

## 2013-04-30 LAB — POCT URINALYSIS DIP (DEVICE)
Bilirubin Urine: NEGATIVE
Glucose, UA: NEGATIVE mg/dL
Hgb urine dipstick: NEGATIVE
Nitrite: NEGATIVE
Specific Gravity, Urine: 1.02 (ref 1.005–1.030)
Urobilinogen, UA: 0.2 mg/dL (ref 0.0–1.0)

## 2013-04-30 MED ORDER — TETANUS-DIPHTH-ACELL PERTUSSIS 5-2.5-18.5 LF-MCG/0.5 IM SUSP
0.5000 mL | Freq: Once | INTRAMUSCULAR | Status: AC
  Administered 2013-04-30: 0.5 mL via INTRAMUSCULAR

## 2013-04-30 NOTE — Progress Notes (Signed)
Pulse- 94 

## 2013-04-30 NOTE — Patient Instructions (Signed)

## 2013-04-30 NOTE — Progress Notes (Signed)
  Subjective:    [name] is a 22 y.o. female being seen today for her obstetrical visit. She is at [redacted]w[redacted]d gestation. Patient reports no bleeding, no contractions, no cramping and no leaking. Fetal movement: normal.  Menstrual History: OB History   Grav Para Term Preterm Abortions TAB SAB Ect Mult Living   1                Patient's last menstrual period was 09/09/2012.    The following portions of the patient's history were reviewed and updated as appropriate: allergies, current medications, past family history, past medical history, past social history, past surgical history and problem list.  Review of Systems Pertinent items are noted in HPI.   Objective:    BP 96/61  Wt 134 lb (60.782 kg)  BMI 22.3 kg/m2  LMP 09/09/2012 FHT:  144 BPM  Uterine Size: 34 cm        Assessment:   [name] is a 22 y.o. G1P0 at [redacted]w[redacted]d by here for ROB visit.  Discussed with Patient:  -Plans to breast feed.  All questions answered. -Continue prenatal vitamins. -Reviewed fetal kick counts Pt to perform daily at a time when the baby is active, lie laterally with both hands on belly in quiet room and count all movements (hiccups, shoulder rolls, obvious kicks, etc); pt is to report to clinic L&D for less than 10 movements felt in a one hour time period-pt told as soon as she counts 10 movements the count is complete.  - Routine precautions discussed (depression, infection s/s).   Patient provided with all pertinent phone numbers for emergencies. - RTC for any VB, regular, painful cramps/ctxs occurring at a rate of >2/10 min, fever (100.5 or higher), n/v/d, any pain that is unresolving or worsening, LOF, decreased fetal movement, CP, SOB, edema - RTC in 2 weeks for next appt. - GBS next visit Problems: Patient Active Problem List   Diagnosis Date Noted  . Marginal placenta previa in second trimester 01/17/2013  . Two vessel umbilical cord, antepartum 01/17/2013  . Supervision of normal first  pregnancy 11/21/2012    To Do: 1. Tdap will be given today  [ ]  Vaccines: Flu:  Tdap:

## 2013-05-05 ENCOUNTER — Other Ambulatory Visit: Payer: Self-pay | Admitting: Advanced Practice Midwife

## 2013-05-05 ENCOUNTER — Ambulatory Visit (HOSPITAL_COMMUNITY)
Admission: RE | Admit: 2013-05-05 | Discharge: 2013-05-05 | Disposition: A | Discharge status: Home / Self Care | Payer: Medicaid Other | Source: Ambulatory Visit | Attending: Advanced Practice Midwife | Admitting: Advanced Practice Midwife

## 2013-05-05 DIAGNOSIS — IMO0001 Reserved for inherently not codable concepts without codable children: Secondary | ICD-10-CM | POA: Insufficient documentation

## 2013-05-05 DIAGNOSIS — Z3689 Encounter for other specified antenatal screening: Secondary | ICD-10-CM | POA: Insufficient documentation

## 2013-05-07 ENCOUNTER — Encounter: Payer: Self-pay | Admitting: *Deleted

## 2013-05-14 ENCOUNTER — Ambulatory Visit (INDEPENDENT_AMBULATORY_CARE_PROVIDER_SITE_OTHER): Payer: Medicaid Other | Admitting: Family

## 2013-05-14 DIAGNOSIS — IMO0001 Reserved for inherently not codable concepts without codable children: Secondary | ICD-10-CM

## 2013-05-14 LAB — POCT URINALYSIS DIP (DEVICE)
Bilirubin Urine: NEGATIVE
Glucose, UA: 100 mg/dL — AB
Ketones, ur: NEGATIVE mg/dL
Leukocytes, UA: NEGATIVE
Nitrite: NEGATIVE
pH: 7 (ref 5.0–8.0)

## 2013-05-14 NOTE — Progress Notes (Signed)
Pulse = 112 

## 2013-05-14 NOTE — Progress Notes (Signed)
Reviewed ultrasound results from last week, growth 63%, nml fluid.  GBS and GC/Ct next visit. 2+ glucose in urine, random CBG 126 after eating 1 hr ago.

## 2013-05-21 ENCOUNTER — Ambulatory Visit (INDEPENDENT_AMBULATORY_CARE_PROVIDER_SITE_OTHER): Payer: BC Managed Care – PPO | Admitting: Family

## 2013-05-21 DIAGNOSIS — Z23 Encounter for immunization: Secondary | ICD-10-CM

## 2013-05-21 DIAGNOSIS — IMO0001 Reserved for inherently not codable concepts without codable children: Secondary | ICD-10-CM

## 2013-05-21 LAB — POCT URINALYSIS DIP (DEVICE)
Ketones, ur: NEGATIVE mg/dL
Leukocytes, UA: NEGATIVE
Nitrite: NEGATIVE
Protein, ur: NEGATIVE mg/dL
Urobilinogen, UA: 0.2 mg/dL (ref 0.0–1.0)
pH: 7 (ref 5.0–8.0)

## 2013-05-21 LAB — OB RESULTS CONSOLE GBS: GBS: NEGATIVE

## 2013-05-21 MED ORDER — INFLUENZA VIRUS VACC SPLIT PF IM SUSP
0.5000 mL | Freq: Once | INTRAMUSCULAR | Status: AC
  Administered 2013-05-21: 0.5 mL via INTRAMUSCULAR

## 2013-05-21 NOTE — Progress Notes (Signed)
P-75 

## 2013-05-21 NOTE — Addendum Note (Signed)
Addended by: Kathee Delton on: 05/21/2013 10:05 AM   Modules accepted: Orders

## 2013-05-22 LAB — GC/CHLAMYDIA PROBE AMP
CT Probe RNA: NEGATIVE
GC Probe RNA: NEGATIVE

## 2013-05-24 LAB — CULTURE, BETA STREP (GROUP B ONLY)

## 2013-05-26 ENCOUNTER — Encounter: Payer: Self-pay | Admitting: Family

## 2013-05-30 ENCOUNTER — Ambulatory Visit (INDEPENDENT_AMBULATORY_CARE_PROVIDER_SITE_OTHER): Payer: Medicaid Other | Admitting: Advanced Practice Midwife

## 2013-05-30 DIAGNOSIS — IMO0001 Reserved for inherently not codable concepts without codable children: Secondary | ICD-10-CM

## 2013-05-30 LAB — POCT URINALYSIS DIP (DEVICE)
Hgb urine dipstick: NEGATIVE
Nitrite: NEGATIVE
Protein, ur: NEGATIVE mg/dL
Specific Gravity, Urine: 1.02 (ref 1.005–1.030)
Urobilinogen, UA: 0.2 mg/dL (ref 0.0–1.0)
pH: 6.5 (ref 5.0–8.0)

## 2013-05-30 NOTE — Progress Notes (Signed)
P=104 pt complains of pubic pain at night. Increase in clear mucous discharge.

## 2013-05-30 NOTE — Patient Instructions (Signed)
Contraception Choices  Contraception (birth control) is the use of any methods or devices to prevent pregnancy. Below are some methods to help avoid pregnancy.  HORMONAL METHODS   · Contraceptive implant. This is a thin, plastic tube containing progesterone hormone. It does not contain estrogen hormone. Your caregiver inserts the tube in the inner part of the upper arm. The tube can remain in place for up to 3 years. After 3 years, the implant must be removed. The implant prevents the ovaries from releasing an egg (ovulation), thickens the cervical mucus which prevents sperm from entering the uterus, and thins the lining of the inside of the uterus.  · Progesterone-only injections. These injections are given every 3 months by your caregiver to prevent pregnancy. This synthetic progesterone hormone stops the ovaries from releasing eggs. It also thickens cervical mucus and changes the uterine lining. This makes it harder for sperm to survive in the uterus.  · Birth control pills. These pills contain estrogen and progesterone hormone. They work by stopping the egg from forming in the ovary (ovulation). Birth control pills are prescribed by a caregiver. Birth control pills can also be used to treat heavy periods.  · Minipill. This type of birth control pill contains only the progesterone hormone. They are taken every day of each month and must be prescribed by your caregiver.  · Birth control patch. The patch contains hormones similar to those in birth control pills. It must be changed once a week and is prescribed by a caregiver.  · Vaginal ring. The ring contains hormones similar to those in birth control pills. It is left in the vagina for 3 weeks, removed for 1 week, and then a new one is put back in place. The patient must be comfortable inserting and removing the ring from the vagina. A caregiver's prescription is necessary.  · Emergency contraception. Emergency contraceptives prevent pregnancy after unprotected  sexual intercourse. This pill can be taken right after sex or up to 5 days after unprotected sex. It is most effective the sooner you take the pills after having sexual intercourse. Emergency contraceptive pills are available without a prescription. Check with your pharmacist. Do not use emergency contraception as your only form of birth control.  BARRIER METHODS   · Female condom. This is a thin sheath (latex or rubber) that is worn over the penis during sexual intercourse. It can be used with spermicide to increase effectiveness.  · Female condom. This is a soft, loose-fitting sheath that is put into the vagina before sexual intercourse.  · Diaphragm. This is a soft, latex, dome-shaped barrier that must be fitted by a caregiver. It is inserted into the vagina, along with a spermicidal jelly. It is inserted before intercourse. The diaphragm should be left in the vagina for 6 to 8 hours after intercourse.  · Cervical cap. This is a round, soft, latex or plastic cup that fits over the cervix and must be fitted by a caregiver. The cap can be left in place for up to 48 hours after intercourse.  · Sponge. This is a soft, circular piece of polyurethane foam. The sponge has spermicide in it. It is inserted into the vagina after wetting it and before sexual intercourse.  · Spermicides. These are chemicals that kill or block sperm from entering the cervix and uterus. They come in the form of creams, jellies, suppositories, foam, or tablets. They do not require a prescription. They are inserted into the vagina with an applicator before having sexual intercourse.   The process must be repeated every time you have sexual intercourse.  INTRAUTERINE CONTRACEPTION  · Intrauterine device (IUD). This is a T-shaped device that is put in a woman's uterus during a menstrual period to prevent pregnancy. There are 2 types:  · Copper IUD. This type of IUD is wrapped in copper wire and is placed inside the uterus. Copper makes the uterus and  fallopian tubes produce a fluid that kills sperm. It can stay in place for 10 years.  · Hormone IUD. This type of IUD contains the hormone progestin (synthetic progesterone). The hormone thickens the cervical mucus and prevents sperm from entering the uterus, and it also thins the uterine lining to prevent implantation of a fertilized egg. The hormone can weaken or kill the sperm that get into the uterus. It can stay in place for 5 years.  PERMANENT METHODS OF CONTRACEPTION  · Female tubal ligation. This is when the woman's fallopian tubes are surgically sealed, tied, or blocked to prevent the egg from traveling to the uterus.  · Female sterilization. This is when the female has the tubes that carry sperm tied off (vasectomy). This blocks sperm from entering the vagina during sexual intercourse. After the procedure, the man can still ejaculate fluid (semen).  NATURAL PLANNING METHODS  · Natural family planning. This is not having sexual intercourse or using a barrier method (condom, diaphragm, cervical cap) on days the woman could become pregnant.  · Calendar method. This is keeping track of the length of each menstrual cycle and identifying when you are fertile.  · Ovulation method. This is avoiding sexual intercourse during ovulation.  · Symptothermal method. This is avoiding sexual intercourse during ovulation, using a thermometer and ovulation symptoms.  · Post-ovulation method. This is timing sexual intercourse after you have ovulated.  Regardless of which type or method of contraception you choose, it is important that you use condoms to protect against the transmission of sexually transmitted diseases (STDs). Talk with your caregiver about which form of contraception is most appropriate for you.  Document Released: 08/07/2005 Document Revised: 10/30/2011 Document Reviewed: 12/14/2010  [employer]® Patient Information ©2014 [employer].

## 2013-05-30 NOTE — Progress Notes (Signed)
Active baby 

## 2013-06-05 ENCOUNTER — Ambulatory Visit (INDEPENDENT_AMBULATORY_CARE_PROVIDER_SITE_OTHER): Payer: Medicaid Other | Admitting: Family Medicine

## 2013-06-05 VITALS — BP 110/66 | Temp 97.2°F | Wt 140.7 lb

## 2013-06-05 DIAGNOSIS — IMO0001 Reserved for inherently not codable concepts without codable children: Secondary | ICD-10-CM

## 2013-06-05 DIAGNOSIS — Z3403 Encounter for supervision of normal first pregnancy, third trimester: Secondary | ICD-10-CM

## 2013-06-05 LAB — POCT URINALYSIS DIP (DEVICE)
Bilirubin Urine: NEGATIVE
Hgb urine dipstick: NEGATIVE
Nitrite: NEGATIVE
Protein, ur: NEGATIVE mg/dL
Urobilinogen, UA: 1 mg/dL (ref 0.0–1.0)
pH: 7 (ref 5.0–8.0)

## 2013-06-05 NOTE — Progress Notes (Signed)
P= 71 Pt. Reports feeling more pressure on bladder but denies pain.

## 2013-06-05 NOTE — Progress Notes (Signed)
Pt 22 yo G1 @ [redacted]w[redacted]d - occasional irregular contractions. No lof, vb. +FM - occasional pressure on bladder  O: see flowsheet  A/P - doing well. No concerns - labor precautions and FM discussed - f/u in 1 week

## 2013-06-13 ENCOUNTER — Ambulatory Visit (INDEPENDENT_AMBULATORY_CARE_PROVIDER_SITE_OTHER): Payer: Medicaid Other | Admitting: Family Medicine

## 2013-06-13 VITALS — BP 122/65 | Temp 97.1°F | Wt 141.0 lb

## 2013-06-13 DIAGNOSIS — Z3403 Encounter for supervision of normal first pregnancy, third trimester: Secondary | ICD-10-CM

## 2013-06-13 DIAGNOSIS — IMO0001 Reserved for inherently not codable concepts without codable children: Secondary | ICD-10-CM

## 2013-06-13 LAB — POCT URINALYSIS DIP (DEVICE)
Ketones, ur: NEGATIVE mg/dL
Leukocytes, UA: NEGATIVE
Protein, ur: NEGATIVE mg/dL
Urobilinogen, UA: 0.2 mg/dL (ref 0.0–1.0)
pH: 7 (ref 5.0–8.0)

## 2013-06-13 NOTE — Progress Notes (Signed)
Patient doing well.  No complaints other than some occasional suprapubic pain with urination. She reports irregular contractions (~5x/day); No bleeding, Loss of fluid.  Good fetal movement.  Physical Exam: See vitals and notes. Follow up in 1 week.  Will schedule NST.

## 2013-06-13 NOTE — Progress Notes (Signed)
Pulse: 69

## 2013-06-20 ENCOUNTER — Telehealth (HOSPITAL_COMMUNITY): Payer: Self-pay | Admitting: *Deleted

## 2013-06-20 ENCOUNTER — Ambulatory Visit (INDEPENDENT_AMBULATORY_CARE_PROVIDER_SITE_OTHER): Payer: Medicaid Other | Admitting: Obstetrics & Gynecology

## 2013-06-20 ENCOUNTER — Encounter (HOSPITAL_COMMUNITY): Payer: Self-pay | Admitting: *Deleted

## 2013-06-20 VITALS — BP 90/61 | Wt 144.6 lb

## 2013-06-20 DIAGNOSIS — O480 Post-term pregnancy: Secondary | ICD-10-CM

## 2013-06-20 LAB — POCT URINALYSIS DIP (DEVICE)
Glucose, UA: NEGATIVE mg/dL
Hgb urine dipstick: NEGATIVE
Nitrite: NEGATIVE
Protein, ur: NEGATIVE mg/dL
Specific Gravity, Urine: 1.02 (ref 1.005–1.030)
Urobilinogen, UA: 0.2 mg/dL (ref 0.0–1.0)

## 2013-06-20 NOTE — Telephone Encounter (Signed)
Preadmission screen  

## 2013-06-20 NOTE — Progress Notes (Signed)
P = 106   Pt desires IOL @ 41 wks- scheduled 11/3 @ 0630

## 2013-06-23 ENCOUNTER — Inpatient Hospital Stay (HOSPITAL_COMMUNITY)
Admission: RE | Admit: 2013-06-23 | Discharge: 2013-06-26 | DRG: 775 | Disposition: A | Discharge status: Home / Self Care | Payer: BC Managed Care – PPO | Source: Ambulatory Visit | Attending: Obstetrics & Gynecology | Admitting: Obstetrics & Gynecology

## 2013-06-23 ENCOUNTER — Encounter (HOSPITAL_COMMUNITY): Payer: Self-pay

## 2013-06-23 DIAGNOSIS — O480 Post-term pregnancy: Principal | ICD-10-CM | POA: Diagnosis present

## 2013-06-23 LAB — CBC
Platelets: 266 10*3/uL (ref 150–400)
RBC: 3.75 MIL/uL — ABNORMAL LOW (ref 3.87–5.11)
WBC: 12.6 10*3/uL — ABNORMAL HIGH (ref 4.0–10.5)

## 2013-06-23 LAB — RPR: RPR Ser Ql: NONREACTIVE

## 2013-06-23 LAB — ABO/RH: ABO/RH(D): B POS

## 2013-06-23 LAB — TYPE AND SCREEN: ABO/RH(D): B POS

## 2013-06-23 MED ORDER — IBUPROFEN 600 MG PO TABS
600.0000 mg | ORAL_TABLET | Freq: Four times a day (QID) | ORAL | Status: DC | PRN
  Administered 2013-06-24: 600 mg via ORAL
  Filled 2013-06-23: qty 1

## 2013-06-23 MED ORDER — DIPHENHYDRAMINE HCL 50 MG/ML IJ SOLN: 12.5000 mg | INTRAMUSCULAR | Status: DC | PRN

## 2013-06-23 MED ORDER — LACTATED RINGERS IV SOLN: 500.0000 mL | INTRAVENOUS | Status: DC | PRN

## 2013-06-23 MED ORDER — EPHEDRINE 5 MG/ML INJ
10.0000 mg | INTRAVENOUS | Status: DC | PRN
  Filled 2013-06-23: qty 2

## 2013-06-23 MED ORDER — ACETAMINOPHEN 325 MG PO TABS: 650.0000 mg | ORAL_TABLET | ORAL | Status: DC | PRN

## 2013-06-23 MED ORDER — LIDOCAINE HCL (PF) 1 % IJ SOLN
30.0000 mL | INTRAMUSCULAR | Status: AC | PRN
  Administered 2013-06-24: 30 mL via SUBCUTANEOUS
  Filled 2013-06-23 (×2): qty 30

## 2013-06-23 MED ORDER — OXYTOCIN 40 UNITS IN LACTATED RINGERS INFUSION - SIMPLE MED: 62.5000 mL/h | INTRAVENOUS | Status: DC

## 2013-06-23 MED ORDER — EPHEDRINE 5 MG/ML INJ
10.0000 mg | INTRAVENOUS | Status: DC | PRN
  Filled 2013-06-23: qty 4
  Filled 2013-06-23: qty 2

## 2013-06-23 MED ORDER — OXYTOCIN BOLUS FROM INFUSION
500.0000 mL | INTRAVENOUS | Status: DC
  Administered 2013-06-24: 500 mL via INTRAVENOUS

## 2013-06-23 MED ORDER — LACTATED RINGERS IV SOLN
500.0000 mL | Freq: Once | INTRAVENOUS | Status: AC
  Administered 2013-06-23: 500 mL via INTRAVENOUS

## 2013-06-23 MED ORDER — LACTATED RINGERS IV SOLN
INTRAVENOUS | Status: DC
  Administered 2013-06-23 (×2): via INTRAVENOUS

## 2013-06-23 MED ORDER — FENTANYL 2.5 MCG/ML BUPIVACAINE 1/10 % EPIDURAL INFUSION (WH - ANES)
14.0000 mL/h | INTRAMUSCULAR | Status: DC | PRN
  Administered 2013-06-24: 14 mL/h via EPIDURAL
  Filled 2013-06-23: qty 125

## 2013-06-23 MED ORDER — PHENYLEPHRINE 40 MCG/ML (10ML) SYRINGE FOR IV PUSH (FOR BLOOD PRESSURE SUPPORT)
80.0000 ug | PREFILLED_SYRINGE | INTRAVENOUS | Status: DC | PRN
  Filled 2013-06-23: qty 10
  Filled 2013-06-23: qty 2

## 2013-06-23 MED ORDER — OXYTOCIN 40 UNITS IN LACTATED RINGERS INFUSION - SIMPLE MED
1.0000 m[IU]/min | INTRAVENOUS | Status: DC
  Administered 2013-06-23: 2 m[IU]/min via INTRAVENOUS
  Filled 2013-06-23: qty 1000

## 2013-06-23 MED ORDER — PHENYLEPHRINE 40 MCG/ML (10ML) SYRINGE FOR IV PUSH (FOR BLOOD PRESSURE SUPPORT)
80.0000 ug | PREFILLED_SYRINGE | INTRAVENOUS | Status: DC | PRN
  Filled 2013-06-23: qty 2

## 2013-06-23 MED ORDER — OXYCODONE-ACETAMINOPHEN 5-325 MG PO TABS: 1.0000 | ORAL_TABLET | ORAL | Status: DC | PRN

## 2013-06-23 MED ORDER — TERBUTALINE SULFATE 1 MG/ML IJ SOLN: 0.2500 mg | Freq: Once | INTRAMUSCULAR | Status: AC | PRN

## 2013-06-23 MED ORDER — FENTANYL CITRATE 0.05 MG/ML IJ SOLN: 100.0000 ug | INTRAMUSCULAR | Status: DC | PRN

## 2013-06-23 MED ORDER — FLEET ENEMA 7-19 GM/118ML RE ENEM: 1.0000 | ENEMA | RECTAL | Status: DC | PRN

## 2013-06-23 MED ORDER — OXYTOCIN 40 UNITS IN LACTATED RINGERS INFUSION - SIMPLE MED: 1.0000 m[IU]/min | INTRAVENOUS | Status: DC

## 2013-06-23 MED ORDER — CITRIC ACID-SODIUM CITRATE 334-500 MG/5ML PO SOLN: 30.0000 mL | ORAL | Status: DC | PRN

## 2013-06-23 MED ORDER — ONDANSETRON HCL 4 MG/2ML IJ SOLN: 4.0000 mg | Freq: Four times a day (QID) | INTRAMUSCULAR | Status: DC | PRN

## 2013-06-23 NOTE — Progress Notes (Signed)
[name] is a 22 y.o. G1P0000 at [redacted]w[redacted]d  admitted for induction of labor due to Post dates..  Subjective: Pt is occasionally feeling ctx, no significant pain, FB still in place.  Objective: BP 126/83  Pulse 71  Temp(Src) 97.6 F (36.4 C) (Oral)  Resp 20  Ht 5\' 6"  (1.676 m)  Wt 65.318 kg (144 lb)  BMI 23.25 kg/m2  LMP 09/09/2012      FHT:  FHR: 120s bpm, variability: moderate,  accelerations:   ,  decelerations:  Absent UC:   none SVE:   Dilation: 3 Exam by:: Dr. Michail Jewels, Dr. Ike Bene  Labs: Lab Results  Component Value Date   WBC 12.6* 06/23/2013   HGB 11.8* 06/23/2013   HCT 35.4* 06/23/2013   MCV 94.4 06/23/2013   PLT 266 06/23/2013    Assessment / Plan: Induction of labor due to postterm,  progressing well on pitocin  Labor: will add pitocin to 6 mU for augmentation Preeclampsia:  no signs or symptoms of toxicity Fetal Wellbeing:  Category I Pain Control:  not needed at thtis time I/D:  n/a Anticipated MOD:  NSVD  [name] 06/23/2013, 2:56 PM

## 2013-06-23 NOTE — H&P (Addendum)
[name] is a 21 y.o. female 22y.o G1P0 at 23w0 presenting for IOL 2/2 postdates. Per patient report she denies vag bleeding, lof, contraction pain. Still with good active fetal movement. Has felt increased round ligament pain towards the end of pregnancy and some suprapubic pressure with urination but otherwise denies consistent contraction pattern.  Otherwise denies n/v/d/chest pain/palps/SOB. No acute complaints.  PNC at [facility] since 1st trimester. GBS neg. Had resolved marginal previa. Normal quad screen. Anatomic [place] sig for 2 vessel cord. Had initial elevated 1 hr glucose screen but normal 3 hr. Desires to breast feed and use mirena for contraception.  Maternal Medical History:  Reason for admission: Nausea.    OB History   Grav Para Term Preterm Abortions TAB SAB Ect Mult Living   1              1. Current  Past Medical History  Diagnosis Date  . Tendon injury     L hand   Past Surgical History  Procedure Laterality Date  . Hand surgery      08/2011- artery & tendon repair- post trauma   . Tendon repair  03/25/2012    Procedure: TENDON REPAIR;  Surgeon: Sharma Covert, MD;  Location: [facility] OR;  Service: Orthopedics;  Laterality: Left;  left Index Finger Tenolysis    Family History: family history is negative for Alcohol abuse, Arthritis, Asthma, Birth defects, Cancer, COPD, Depression, Diabetes, Drug abuse, Early death, Hearing loss, Heart disease, Hyperlipidemia, Hypertension, Kidney disease, Learning disabilities, Mental illness, Mental retardation, Miscarriages / Stillbirths, Stroke, Vision loss, and Varicose Veins. Social History:  reports that she has never smoked. She has never used smokeless tobacco. She reports that she does not drink alcohol or use illicit drugs.   Prenatal Transfer Tool  Maternal Diabetes: No Genetic Screening: Normal Maternal Ultrasounds/Referrals: Abnormal:  Findings:   Other:2VC, nml growth and AFI (63% at 34wk) Fetal Ultrasounds or other Referrals:   None Maternal Substance Abuse:  No Significant Maternal Medications:  None Significant Maternal Lab Results:  None Other Comments:  None  Review of Systems  Constitutional: Negative for fever and diaphoresis.  HENT: Positive for sore throat. Negative for congestion.   Eyes: Negative for blurred vision.  Respiratory: Negative for cough, shortness of breath and wheezing.   Cardiovascular: Negative for chest pain, palpitations and leg swelling.  Gastrointestinal: Positive for heartburn. Negative for nausea, vomiting, abdominal pain, diarrhea and constipation.  Genitourinary: Negative for dysuria, urgency, frequency and hematuria.  Musculoskeletal: Negative for myalgias.  Skin: Negative for rash.  Neurological: Negative for dizziness, seizures, loss of consciousness, weakness and headaches.  Endo/Heme/Allergies: Does not bruise/bleed easily.  Psychiatric/Behavioral: Negative for depression.      Blood pressure 111/72, pulse 86, temperature 97.6 F (36.4 C), temperature source Oral, resp. rate 20, height 5\' 6"  (1.676 m), weight 65.318 kg (144 lb), last menstrual period 09/09/2012. Exam Physical Exam  Constitutional: She is oriented to person, place, and time. She appears well-developed and well-nourished. No distress.  HENT:  Head: Normocephalic and atraumatic.  Eyes: EOM are normal.  Neck: Normal range of motion. Neck supple.  Cardiovascular: Normal rate, regular rhythm and normal heart sounds.   No murmur heard. Respiratory: Effort normal and breath sounds normal. No respiratory distress.  GI: Soft. Bowel sounds are normal. She exhibits no distension. There is no tenderness.  gravid  Musculoskeletal: Normal range of motion. She exhibits no edema.  Neurological: She is alert and oriented to person, place, and time. She has  normal reflexes.  Skin: Skin is warm and dry. No erythema.  Psychiatric: She has a normal mood and affect. Her behavior is normal.    Dilation:  3 Presentation: Vertex Exam by:: Dr. Michail Jewels, Dr. Ike Bene   Prenatal labs: ABO, Rh: B/POS/-- (03/06 1441) Antibody: NEG (03/06 1441) Rubella: 15.20 (03/06 1441) RPR: NON REAC (08/13 1020)  HBsAg: NEGATIVE (03/06 1441)  HIV: NON REACTIVE (08/13 1020)  GBS: Negative (10/01 0000)   FHR- baseline 140, mod var, post accel, no decel Toco: none  Assessment/Plan: [name] is a 22 y.o G1P0 presenting at 41 wk for IOL 2/2 postdates  1. IOL- cervical exam changed from 1--> 2.5 sicne last check. No consistent contraction pattern here. Will plan to induce with foley and then pit -routine orders -pain control with fentanyl prn and epidural when pt in active stage of labor -HIV NR, GBS neg -expect NSVD  2. FHR- reassuring -cat I tracing  3. Postpartum- f/up at [facility] in 4-6 weeks -plans for mirena for contraception -desires to breast feed -is undecided about circ  Anselm Lis 06/23/2013, 7:25 AM  [name] is a 22 y.o. G1P0000 at [redacted]w[redacted]d for IOL for postdates #Labor: IOL with FB will follow with pit #Pain: Desires epidural as eneded #FWB: Cat I #ID:  GBS Neg #MOF: breast #MOC:mirena #Circ:  undecided   I spoke with and examined patient and agree with resident's note and plan of care.  Tawana Scale, MD OB Fellow 06/23/2013 11:57 AM

## 2013-06-23 NOTE — Progress Notes (Signed)
NST performed today was reviewed and was found to be reactive.  IOL scheduled on 06/23/13.

## 2013-06-24 ENCOUNTER — Encounter (HOSPITAL_COMMUNITY): Payer: BC Managed Care – PPO | Admitting: Anesthesiology

## 2013-06-24 ENCOUNTER — Inpatient Hospital Stay (HOSPITAL_COMMUNITY): Payer: BC Managed Care – PPO | Admitting: Anesthesiology

## 2013-06-24 ENCOUNTER — Encounter (HOSPITAL_COMMUNITY): Payer: Self-pay

## 2013-06-24 DIAGNOSIS — O480 Post-term pregnancy: Secondary | ICD-10-CM

## 2013-06-24 MED ORDER — TETANUS-DIPHTH-ACELL PERTUSSIS 5-2.5-18.5 LF-MCG/0.5 IM SUSP: 0.5000 mL | Freq: Once | INTRAMUSCULAR | Status: DC

## 2013-06-24 MED ORDER — ONDANSETRON HCL 4 MG PO TABS: 4.0000 mg | ORAL_TABLET | ORAL | Status: DC | PRN

## 2013-06-24 MED ORDER — DIBUCAINE 1 % RE OINT: 1.0000 "application " | TOPICAL_OINTMENT | RECTAL | Status: DC | PRN

## 2013-06-24 MED ORDER — MISOPROSTOL 200 MCG PO TABS: 800.0000 ug | ORAL_TABLET | Freq: Once | ORAL | Status: DC

## 2013-06-24 MED ORDER — OXYCODONE-ACETAMINOPHEN 5-325 MG PO TABS: 1.0000 | ORAL_TABLET | ORAL | Status: DC | PRN

## 2013-06-24 MED ORDER — BENZOCAINE-MENTHOL 20-0.5 % EX AERO
1.0000 "application " | INHALATION_SPRAY | CUTANEOUS | Status: DC | PRN
  Administered 2013-06-24: 1 via TOPICAL
  Filled 2013-06-24: qty 56

## 2013-06-24 MED ORDER — WITCH HAZEL-GLYCERIN EX PADS: 1.0000 "application " | MEDICATED_PAD | CUTANEOUS | Status: DC | PRN

## 2013-06-24 MED ORDER — LIDOCAINE HCL (PF) 1 % IJ SOLN
INTRAMUSCULAR | Status: DC | PRN
  Administered 2013-06-24 (×4): 4 mL

## 2013-06-24 MED ORDER — ONDANSETRON HCL 4 MG/2ML IJ SOLN: 4.0000 mg | INTRAMUSCULAR | Status: DC | PRN

## 2013-06-24 MED ORDER — LANOLIN HYDROUS EX OINT: 1.0000 "application " | TOPICAL_OINTMENT | CUTANEOUS | Status: DC | PRN

## 2013-06-24 MED ORDER — MISOPROSTOL 200 MCG PO TABS
ORAL_TABLET | ORAL | Status: AC
  Filled 2013-06-24: qty 4

## 2013-06-24 MED ORDER — SENNOSIDES-DOCUSATE SODIUM 8.6-50 MG PO TABS
2.0000 | ORAL_TABLET | ORAL | Status: DC
  Administered 2013-06-25 – 2013-06-26 (×2): 2 via ORAL
  Filled 2013-06-24 (×2): qty 2

## 2013-06-24 MED ORDER — PRENATAL MULTIVITAMIN CH
1.0000 | ORAL_TABLET | Freq: Every day | ORAL | Status: DC
  Administered 2013-06-24 – 2013-06-26 (×2): 1 via ORAL
  Filled 2013-06-24 (×2): qty 1

## 2013-06-24 MED ORDER — SIMETHICONE 80 MG PO CHEW: 80.0000 mg | CHEWABLE_TABLET | ORAL | Status: DC | PRN

## 2013-06-24 MED ORDER — ZOLPIDEM TARTRATE 5 MG PO TABS: 5.0000 mg | ORAL_TABLET | Freq: Every evening | ORAL | Status: DC | PRN

## 2013-06-24 MED ORDER — MAGNESIUM HYDROXIDE 400 MG/5ML PO SUSP: 30.0000 mL | ORAL | Status: DC | PRN

## 2013-06-24 MED ORDER — DIPHENHYDRAMINE HCL 25 MG PO CAPS: 25.0000 mg | ORAL_CAPSULE | Freq: Four times a day (QID) | ORAL | Status: DC | PRN

## 2013-06-24 MED ORDER — MEASLES, MUMPS & RUBELLA VAC ~~LOC~~ INJ
0.5000 mL | INJECTION | Freq: Once | SUBCUTANEOUS | Status: DC
  Filled 2013-06-24: qty 0.5

## 2013-06-24 MED ORDER — FERROUS SULFATE 325 (65 FE) MG PO TABS
325.0000 mg | ORAL_TABLET | Freq: Two times a day (BID) | ORAL | Status: DC
  Administered 2013-06-24 – 2013-06-26 (×4): 325 mg via ORAL
  Filled 2013-06-24 (×4): qty 1

## 2013-06-24 MED ORDER — IBUPROFEN 600 MG PO TABS
600.0000 mg | ORAL_TABLET | Freq: Four times a day (QID) | ORAL | Status: DC
  Administered 2013-06-24 – 2013-06-26 (×8): 600 mg via ORAL
  Filled 2013-06-24 (×8): qty 1

## 2013-06-24 NOTE — OB Triage Provider Note (Signed)
I was present for the delivery, performed the repair and agree with above.  [place], CNM 06/24/2013 2:03 AM

## 2013-06-24 NOTE — OB Triage Provider Note (Signed)
Delivery Note At 1:08 AM a viable female was delivered via Vaginal, Spontaneous Delivery (Presentation: ; Occiput Posterior).  APGAR: 8, 9; weight .   Placenta status: Intact, Spontaneous.  Cord: 2 vessels with the following complications: None.  Cord pH: NA  Anesthesia: Epidural  Episiotomy: None Lacerations: Sulcus Suture Repair: vicryl Est. Blood Loss (mL): 500  Mom to postpartum.  Baby to Couplet care / Skin to Skin.  Jefm Petty 06/24/2013, 1:51 AM

## 2013-06-24 NOTE — Anesthesia Postprocedure Evaluation (Signed)
Anesthesia Post Note  Patient: [name]  Procedure(s) Performed: * No procedures listed *  Anesthesia type: Epidural  Patient location: Mother/Baby  Post pain: Pain level controlled  Post assessment: Post-op Vital signs reviewed  Last Vitals:  Filed Vitals:   06/24/13 0414  BP: 120/60  Pulse: 78  Temp: 36.8 C  Resp:     Post vital signs: Reviewed  Level of consciousness: awake  Complications: No apparent anesthesia complications

## 2013-06-24 NOTE — Anesthesia Procedure Notes (Signed)
Epidural Patient location during procedure: OB Start time: 06/24/2013 12:26 AM  Staffing Performed by: anesthesiologist   Preanesthetic Checklist Completed: patient identified, site marked, surgical consent, pre-op evaluation, timeout performed, IV checked, risks and benefits discussed and monitors and equipment checked  Epidural Patient position: sitting Prep: site prepped and draped and DuraPrep Patient monitoring: continuous pulse ox and blood pressure Approach: midline Injection technique: LOR air  Needle:  Needle type: Tuohy  Needle gauge: 17 G Needle length: 9 cm and 9 Needle insertion depth: 4.5 cm Catheter type: closed end flexible Catheter size: 19 Gauge Catheter at skin depth: 9.5 cm Test dose: negative  Assessment Events: blood not aspirated, injection not painful, no injection resistance, negative IV test and no paresthesia  Additional Notes Discussed risk of headache, infection, bleeding, nerve injury and failed or incomplete block.  Patient voices understanding and wishes to proceed.  Epidural placed easily on first attempt.  No paresthesia.  Patient tolerated procedure well with no apparent complications. Jasmine December, MDReason for block:procedure for pain

## 2013-06-24 NOTE — Anesthesia Preprocedure Evaluation (Signed)

## 2013-06-25 MED ORDER — IBUPROFEN 600 MG PO TABS: 600.0000 mg | ORAL_TABLET | Freq: Four times a day (QID) | ORAL | Status: DC

## 2013-06-25 NOTE — Discharge Summary (Signed)
Saw patient and agree with note. F/U for Mirena at [facility] (2-Rh) visit  Adam Phenix, MD

## 2013-06-25 NOTE — Discharge Summary (Signed)
Obstetric Discharge Summary Reason for Admission: induction of labor Prenatal Procedures: none Intrapartum Procedures: spontaneous vaginal delivery Postpartum Procedures: none Complications-Operative and Postpartum: 1st degree sulcus laceration; repair N/A Hemoglobin  Date Value Range Status  06/23/2013 11.8* 12.0 - 15.0 g/dL Final     HCT  Date Value Range Status  06/23/2013 35.4* 36.0 - 46.0 % Final    Physical Exam:  General: alert, cooperative and no distress Lochia: appropriate Uterine Fundus: firm Incision: N/A DVT Evaluation: No evidence of DVT seen on physical exam. Negative Homan's sign. No cords or calf tenderness. No significant calf/ankle edema.  Discharge Diagnoses: Term Pregnancy-delivered  Discharge Information: Date: 06/25/2013 Activity: unrestricted Diet: routine Medications: Ibuprofen Condition: stable Instructions: F/U in clinic 4-6 weeks for post-partum checkup Discharge to: home  Newborn Data: Live born female  Birth Weight: 7 lb 15.5 oz (3615 g) APGAR: 8, 9  Home with mother.  Discharge Summary: [name] is a 22 yo G1P1001 who presented at [redacted]w[redacted]d for 2/2 postdates IOL on 11/3. She delivered a viable baby boy at 1:08 am via SVD on 11/4. No significant post-partum complications. 1st degree sulcus lac, not needing repair.  EBL .  2VC, known from anatomy U/S.  Cord pH N/A.   [name] states that she is feeling much better today and that her pain has improved a lot; it is well controlled on Motrin 600mg  Q6H, and is currently 3/10.  Her bleeding is now more like a regular period, and she denies passing any clots. She is urinating and passing flatus per usual, but has not had a BM.  She is ambulatory per normal s/p epidural, and has regained full strength and sensation of her legs.  She denies any HA, CP, SOB, N/V, lower extremity swelling or pain, fever or chills. She is breastfeeding and plans on using Mirena as contraception.  She wishes to go home  today if baby is cleared. She will schedule her 4-6 week post-partum f/u at d/c, and baby has established pediatric care.  She and her husband do not desire circumcision.    [name] 06/25/2013, 6:39 AM

## 2013-06-26 NOTE — Discharge Summary (Signed)
Obstetric Discharge Summary Reason for Admission: onset of labor and induction of labor Prenatal Procedures: NST and ultrasound Intrapartum Procedures: spontaneous vaginal delivery Postpartum Procedures: none Complications-Operative and Postpartum: vaginal laceration; 2 vessel cord Hemoglobin  Date Value Range Status  06/23/2013 11.8* 12.0 - 15.0 g/dL Final     HCT  Date Value Range Status  06/23/2013 35.4* 36.0 - 46.0 % Final    Physical Exam:  General: alert, cooperative, appears stated age and no distress Lochia: appropriate Uterine Fundus: firm Incision: NA  DVT Evaluation: No evidence of DVT seen on physical exam. No cords or calf tenderness. No significant calf/ankle edema. Cardiac: Normal S1/S2 with regular rate; no S3/S4, murmur, rub. Respiratory: Lungs clear to auscultation bilaterally. Mother breastfeeding successfully.  Discharge Diagnoses: Post-date pregnancy  Discharge Information: Date: 06/26/2013 Activity: unrestricted Diet: routine Medications: Ibuprofen Condition: improved Instructions: refer to practice specific booklet Discharge to: home Follow-up Information   Follow up with [facility] In 4 weeks. (f/u for post-partum appointment in 4-6 weeks)    Contact information:   [address] [place] [address] [phone]      Follow up with [facility]. Call in 5 weeks.   Contact information:   [address] [place] [address] [phone]     Desires Mirena for contraception at follow-up.  Newborn Data: Live born female  Birth Weight: 7 lb 15.5 oz (3615 g) APGAR: 8, 9  Home with mother after evaluation of bowel and bladder habits. Baby was moving bowel and voiding bladder regularly until morning of 11/5; now both nursing and mother report baby is not voiding bladder or bowel. Baby is passing gas.. No circumcision desired.  Jefm Petty 06/26/2013, 7:14 AM  Pt was an IOL post dates with NSVD 2/ sulcal tear repaired  in usual fashion. No postpartum complications. Desires mirena. Breast feeding. dischargd home without issue.  Tawana Scale, MD OB Fellow

## 2013-06-27 NOTE — Discharge Summary (Signed)
Attestation of Attending Supervision of Advanced Practitioner (CNM/NP): Evaluation and management procedures were performed by the Advanced Practitioner under my supervision and collaboration.  I have reviewed the Advanced Practitioner's note and chart, and I agree with the management and plan.  Wilder Amodei 06/27/2013 6:06 AM

## 2013-07-23 ENCOUNTER — Ambulatory Visit (INDEPENDENT_AMBULATORY_CARE_PROVIDER_SITE_OTHER): Payer: BC Managed Care – PPO | Admitting: Obstetrics and Gynecology

## 2013-07-23 ENCOUNTER — Encounter: Payer: Self-pay | Admitting: Obstetrics and Gynecology

## 2013-07-23 DIAGNOSIS — Z01812 Encounter for preprocedural laboratory examination: Secondary | ICD-10-CM

## 2013-07-23 DIAGNOSIS — Z30430 Encounter for insertion of intrauterine contraceptive device: Secondary | ICD-10-CM

## 2013-07-23 LAB — POCT PREGNANCY, URINE: Preg Test, Ur: NEGATIVE

## 2013-07-23 MED ORDER — LEVONORGESTREL 20 MCG/24HR IU IUD
INTRAUTERINE_SYSTEM | Freq: Once | INTRAUTERINE | Status: DC
  Administered 2013-07-23: 15:00:00 via INTRAUTERINE

## 2013-07-23 MED ORDER — LEVONORGESTREL 20 MCG/24HR IU IUD: 1.0000 | INTRAUTERINE_SYSTEM | Freq: Once | INTRAUTERINE | Status: DC

## 2013-07-23 NOTE — Addendum Note (Signed)
Addended by: Gerome Apley on: 07/23/2013 03:14 PM   Modules accepted: Orders

## 2013-07-23 NOTE — Progress Notes (Signed)
  Subjective:     [name] is a 22 y.o. female who presents for a postpartum visit. She is 4 weeks postpartum following a spontaneous vaginal delivery. I have fully reviewed the prenatal and intrapartum course. The delivery was at 41 gestational weeks. Outcome: spontaneous vaginal delivery. Anesthesia: epidural. Postpartum course has been uncomplicated. Baby's course has been uncomplicated. Baby is feeding by breast. Bleeding thin lochia. Bowel function is normal. Bladder function is normal. Patient is not sexually active. Contraception method is none. Postpartum depression screening: negative. Patient reports feeling overwhelmed at times as she is often alone with infant while fiance works. Patient states things are getting better and denies any suicidal/homicidal ideation     Review of Systems A comprehensive review of systems was negative.   Objective:    There were no vitals taken for this visit.  General:  alert, cooperative and no distress   Breasts:  inspection negative, no nipple discharge or bleeding, no masses or nodularity palpable  Lungs: clear to auscultation bilaterally  Heart:  regular rate and rhythm  Abdomen: soft, non-tender; bowel sounds normal; no masses,  no organomegaly   Vulva:  normal  Vagina: normal vagina, no discharge, exudate, lesion, or erythema  Cervix:  multiparous appearance  Corpus: normal size, contour, position, consistency, mobility, non-tender  Adnexa:  no mass, fullness, tenderness  Rectal Exam: Not performed.        Assessment:     Normal postpartum exam. Pap smear done at today's visit.   Plan:    1. Contraception: IUD 2. IUD Procedure Note Patient identified, informed consent performed, signed copy in chart, time out was performed.  Urine pregnancy test negative.  Speculum placed in the vagina.  Cervix visualized.  Cleaned with Betadine x 2.  Grasped anteriorly with a single tooth tenaculum.  Uterus sounded to 7 cm.  Mirena IUD placed per  manufacturer's recommendations.  Strings trimmed to 3 cm. Tenaculum was removed, good hemostasis noted.  Patient tolerated procedure well.   Patient given post procedure instructions and Mirena care card with expiration date.  Patient is asked to check IUD strings periodically and follow up in 4-6 weeks for IUD check.   3. Follow up in: 4 weeks for string checks or as needed.

## 2013-07-24 ENCOUNTER — Encounter: Payer: Self-pay | Admitting: *Deleted

## 2013-09-04 ENCOUNTER — Ambulatory Visit: Payer: BC Managed Care – PPO | Admitting: Obstetrics & Gynecology

## 2013-09-11 ENCOUNTER — Ambulatory Visit (INDEPENDENT_AMBULATORY_CARE_PROVIDER_SITE_OTHER): Payer: 59 | Admitting: Obstetrics & Gynecology

## 2013-09-11 ENCOUNTER — Encounter: Payer: Self-pay | Admitting: Obstetrics & Gynecology

## 2013-09-11 VITALS — BP 100/52 | HR 67 | Temp 97.3°F | Ht 67.0 in | Wt 108.6 lb

## 2013-09-11 DIAGNOSIS — Z30431 Encounter for routine checking of intrauterine contraceptive device: Secondary | ICD-10-CM

## 2013-09-11 DIAGNOSIS — N390 Urinary tract infection, site not specified: Secondary | ICD-10-CM

## 2013-09-11 LAB — POCT URINALYSIS DIP (DEVICE)
Bilirubin Urine: NEGATIVE
Glucose, UA: NEGATIVE mg/dL
Hgb urine dipstick: NEGATIVE
Nitrite: NEGATIVE
PH: 5.5 (ref 5.0–8.0)
Protein, ur: NEGATIVE mg/dL
Specific Gravity, Urine: >=1.03 (ref 1.005–1.030)
UROBILINOGEN UA: 0.2 mg/dL (ref 0.0–1.0)

## 2013-09-11 MED ORDER — SULFAMETHOXAZOLE-TMP DS 800-160 MG PO TABS: 1.0000 | ORAL_TABLET | Freq: Two times a day (BID) | ORAL | Status: DC

## 2013-09-11 NOTE — Patient Instructions (Signed)
Urinary Tract Infection  Urinary tract infections (UTIs) can develop anywhere along your urinary tract. Your urinary tract is your body's drainage system for removing wastes and extra water. Your urinary tract includes two kidneys, two ureters, a bladder, and a urethra. Your kidneys are a pair of bean-shaped organs. Each kidney is about the size of your fist. They are located below your ribs, one on each side of your spine.  CAUSES  Infections are caused by microbes, which are microscopic organisms, including fungi, viruses, and bacteria. These organisms are so small that they can only be seen through a microscope. Bacteria are the microbes that most commonly cause UTIs.  SYMPTOMS   Symptoms of UTIs may vary by age and gender of the patient and by the location of the infection. Symptoms in young women typically include a frequent and intense urge to urinate and a painful, burning feeling in the bladder or urethra during urination. Older women and men are more likely to be tired, shaky, and weak and have muscle aches and abdominal pain. A fever may mean the infection is in your kidneys. Other symptoms of a kidney infection include pain in your back or sides below the ribs, nausea, and vomiting.  DIAGNOSIS  To diagnose a UTI, your caregiver will ask you about your symptoms. Your caregiver also will ask to provide a urine sample. The urine sample will be tested for bacteria and white blood cells. White blood cells are made by your body to help fight infection.  TREATMENT   Typically, UTIs can be treated with medication. Because most UTIs are caused by a bacterial infection, they usually can be treated with the use of antibiotics. The choice of antibiotic and length of treatment depend on your symptoms and the type of bacteria causing your infection.  HOME CARE INSTRUCTIONS   If you were prescribed antibiotics, take them exactly as your caregiver instructs you. Finish the medication even if you feel better after you  have only taken some of the medication.   Drink enough water and fluids to keep your urine clear or pale yellow.   Avoid caffeine, tea, and carbonated beverages. They tend to irritate your bladder.   Empty your bladder often. Avoid holding urine for long periods of time.   Empty your bladder before and after sexual intercourse.   After a bowel movement, women should cleanse from front to back. Use each tissue only once.  SEEK MEDICAL CARE IF:    You have back pain.   You develop a fever.   Your symptoms do not begin to resolve within 3 days.  SEEK IMMEDIATE MEDICAL CARE IF:    You have severe back pain or lower abdominal pain.   You develop chills.   You have nausea or vomiting.   You have continued burning or discomfort with urination.  MAKE SURE YOU:    Understand these instructions.   Will watch your condition.   Will get help right away if you are not doing well or get worse.  Document Released: 05/17/2005 Document Revised: 02/06/2012 Document Reviewed: 09/15/2011  Exit[employer] Patient Information 2014 [employer].

## 2013-09-11 NOTE — Progress Notes (Signed)
Subjective:     Patient ID: [name], female   DOB: [birth year], 23 y.o.   MRN: [id number]  HPI Pt reports of burning with urination.  Sx worse previously and improved slightly.  Bleeding improved mildly.  IUD placed ~5 weeks prev   Review of Systems     Objective:   Physical Exam BP 100/52  Pulse 67  Temp(Src) 97.3 F (36.3 C) (Oral)  Ht 5\' 7"  (1.702 m)  Wt 108 lb 9.6 oz (49.261 kg)  BMI 17.01 kg/m2  Breastfeeding? No GU: EGBUS: no lesions Vagina: no blood in vault; strings noted 3 cm long Cervix: no lesion; no mucopurulent d/c   UA- large leuks; neg nitrates     Assessment:     IUD check   UTI    Plan:     Bactrim DS 1 po bid x 7 days F/u UTI F/u urine cx

## 2013-09-13 LAB — URINE CULTURE

## 2014-01-02 IMAGING — US US OB FOLLOW-UP
1 series · 12 of 28 positions shown · non-contrast
Comparison: none

[Series 1: us ob follow up · 12 of 33 slices shown]
[im 2/33]
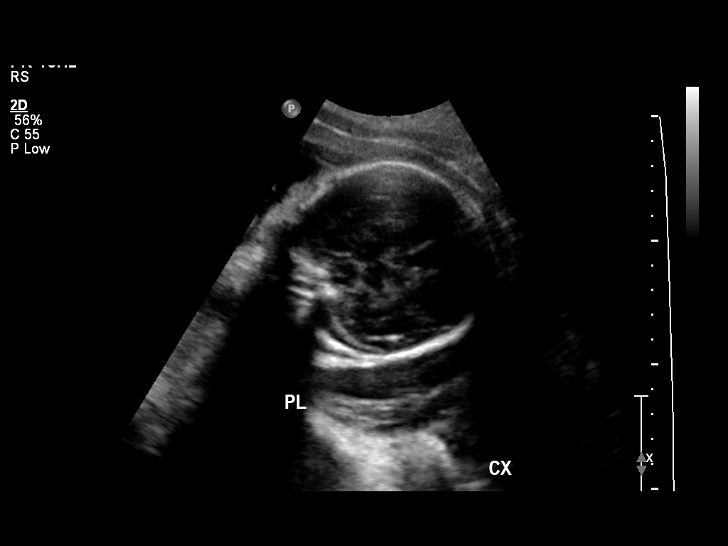
[im 4/33]
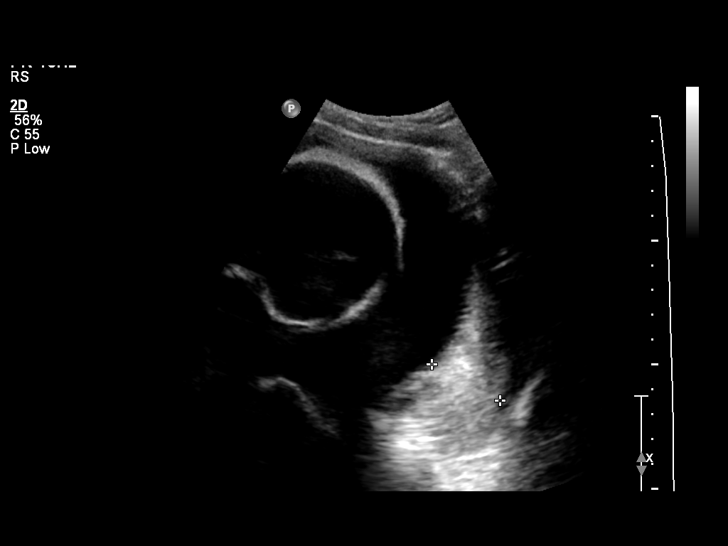
[im 6/33]
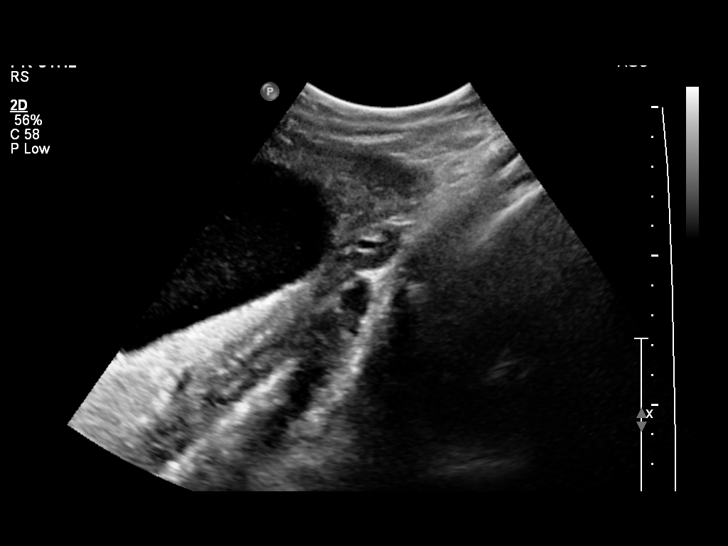
[im 10/33]
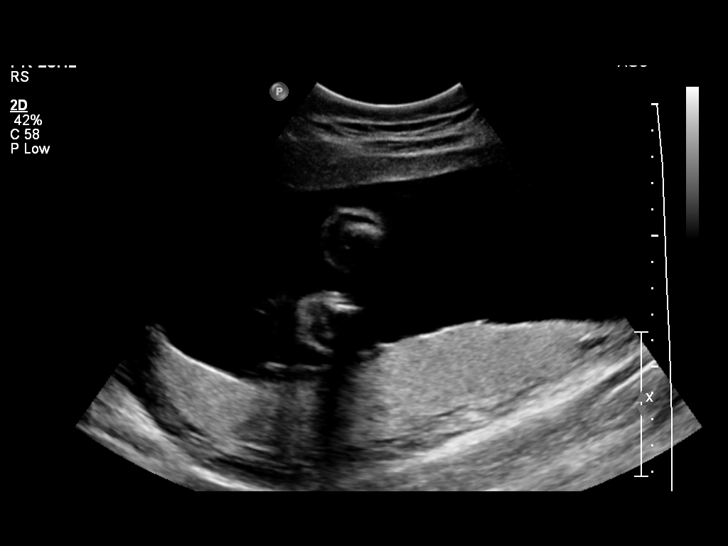
[im 12/33]
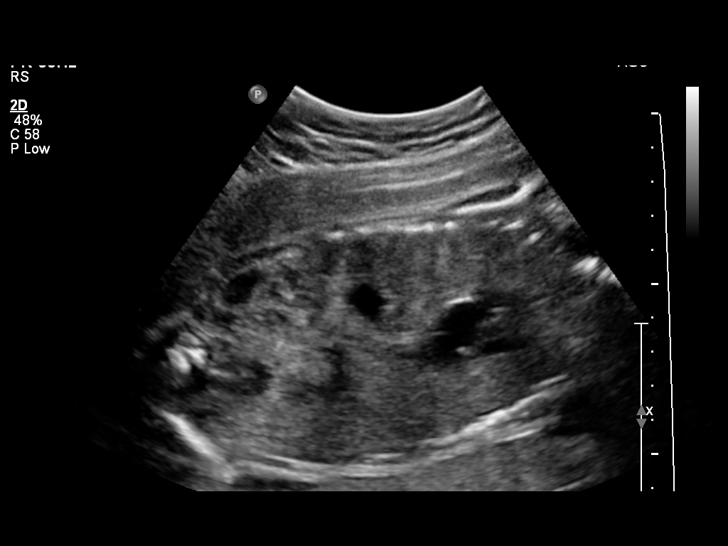
[im 15/33]
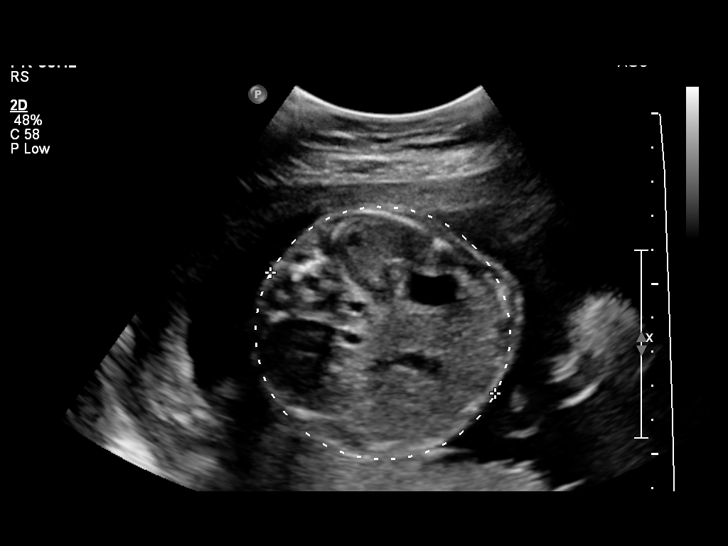
[im 18/33]
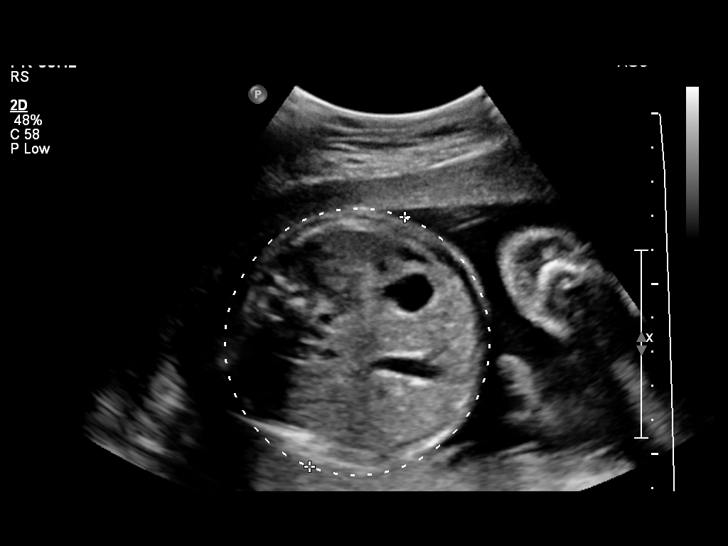
[im 21/33]
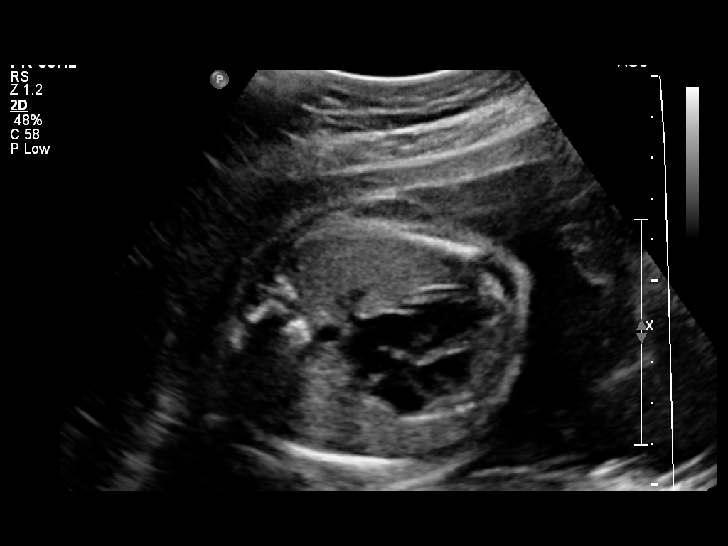
[im 23/33]
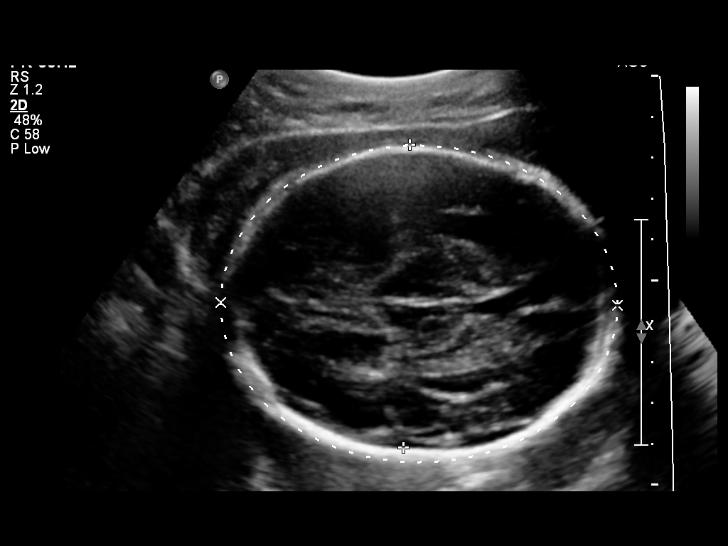
[im 27/33]
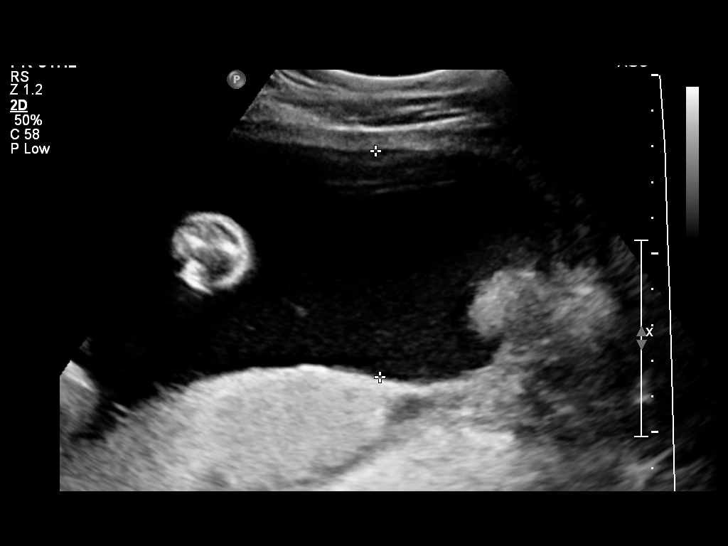
[im 29/33]
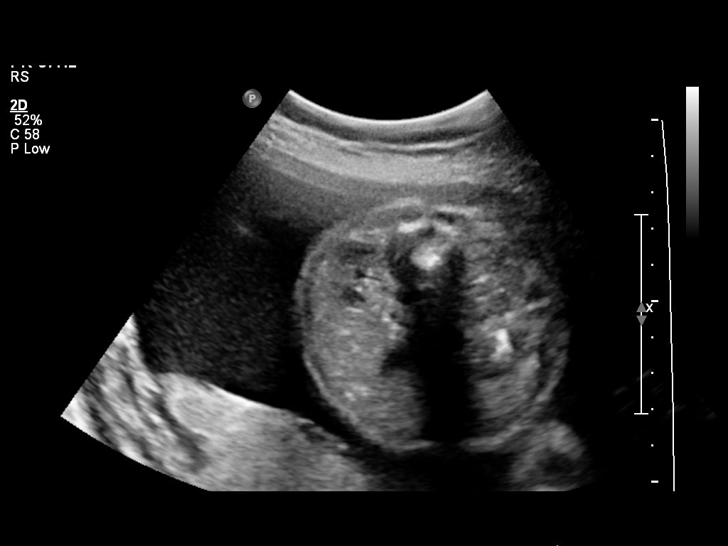
[im 31/33]
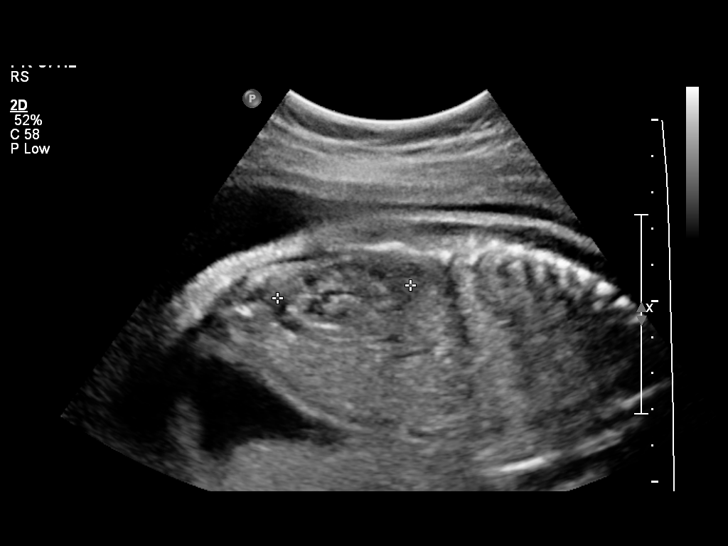

[12 of 28 positions shown; findings below may reference images not displayed]

OBSTETRICS REPORT
                      (Signed Final 03/31/2013 [DATE])

Service(s) Provided

 US OB FOLLOW UP                                       76816.1
Indications

 2 vessel umbilical cord
 Placenta previa/Low lying: No bleeding
Fetal Evaluation

 Num Of Fetuses:    1
 Fetal Heart Rate:  135                         bpm
 Cardiac Activity:  Observed
 Presentation:      Cephalic
 Placenta:          Posterior, above cervical
                    os
 P. Cord            Previously Visualized
 Insertion:

 Amniotic Fluid
 AFI FV:      Subjectively within normal limits
 AFI Sum:     20.84   cm      83   %Tile     Larg Pckt:   6.31   cm
 RUQ:   5.1    cm    RLQ:    4.03   cm    LUQ:   5.4     cm   LLQ:    6.31   cm
Biometry

 BPD:     74.1  mm    G. Age:   29w 5d                CI:        72.64   70 - 86
                                                      FL/HC:      20.0   19.6 -

 HC:     276.5  mm    G. Age:   30w 2d       54  %    HC/AC:      1.16   0.99 -

 AC:     238.1  mm    G. Age:   28w 1d       20  %    FL/BPD:     74.6   71 - 87
 FL:      55.3  mm    G. Age:   29w 1d       40  %    FL/AC:      23.2   20 - 24

 Est. FW:    6526  gm    2 lb 13 oz      47  %
Gestational Age

 LMP:           29w 0d       Date:   09/09/12                 EDD:   06/16/13
 U/S Today:     29w 2d                                        EDD:   06/14/13
 Best:          29w 0d    Det. By:   LMP  (09/09/12)          EDD:   06/16/13
Anatomy

 Cranium:          Appears normal         Aortic Arch:      Previously seen
 Fetal Cavum:      Previously seen        Ductal Arch:      Previously seen
 Ventricles:       Appears normal         Diaphragm:        Appears normal
 Choroid Plexus:   Previously seen        Stomach:          Appears normal
 Cerebellum:       Previously seen        Abdomen:          Appears normal
 Posterior Fossa:  Previously seen        Abdominal Wall:   Previously seen
 Nuchal Fold:      Previously seen        Cord Vessels:     2 vessel cord,
                                                            absent right Ha
                                                            Pita
 Face:             Orbits and profile     Kidneys:          Appear normal
                   previously seen
 Lips:             Previously seen        Bladder:          Appears normal
 Heart:            Appears normal         Spine:            Previously seen
                   (4CH, axis, and
                   situs)
 RVOT:             Previously seen        Lower             Previously seen
                                          Extremities:
 LVOT:             Previously seen        Upper             Previously seen
                                          Extremities:

 Other:  Nasal bone, Heels, and 5th digit previously visualized. Male gender.
         Technically difficult due to fetal position.
Cervix Uterus Adnexa

 Cervical Length:   3.1       cm

 Cervix:       Normal appearance by transabdominal scan.

 Adnexa:     No abnormality visualized.
Impression

 Single live IUP in cephalic presentation.  Concordant
 measurements/assigned GA by LMP.
 2 vessel cord reidentified.
 No late-developing anomaly in visualized structures above.

 questions or concerns.

## 2014-02-06 IMAGING — US US OB FOLLOW-UP
1 series · 12 of 28 positions shown · non-contrast
Comparison: none

[Series 1: us ob follow up · 12 of 37 slices shown]
[im 2/37]
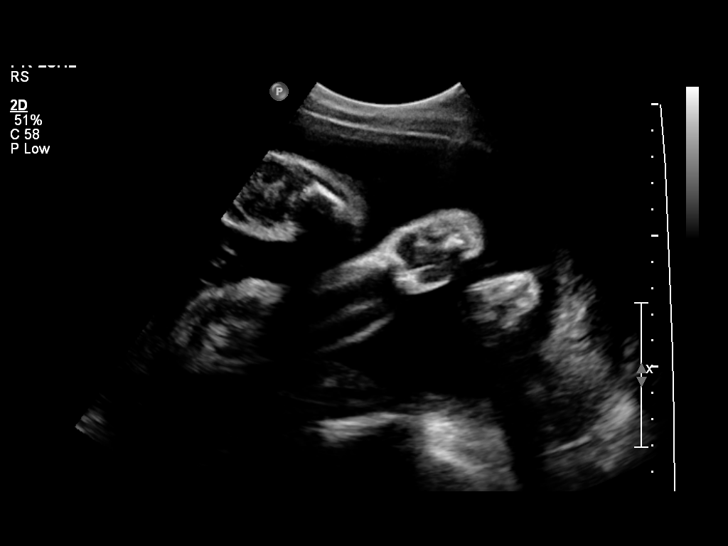
[im 5/37]
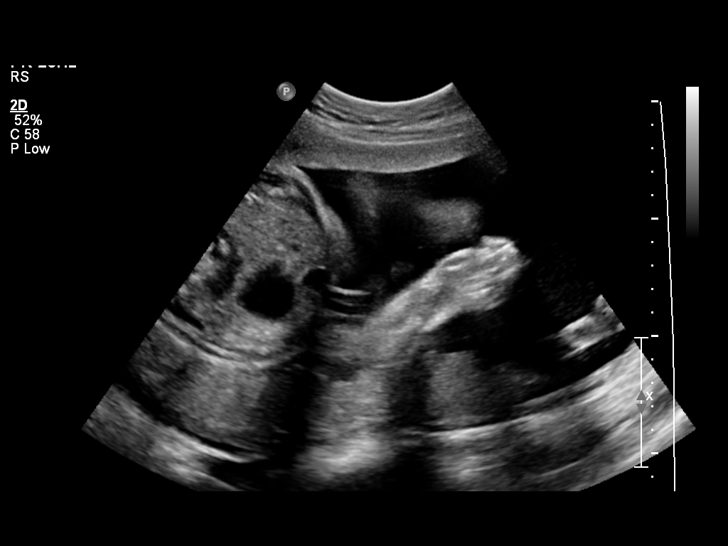
[im 7/37]
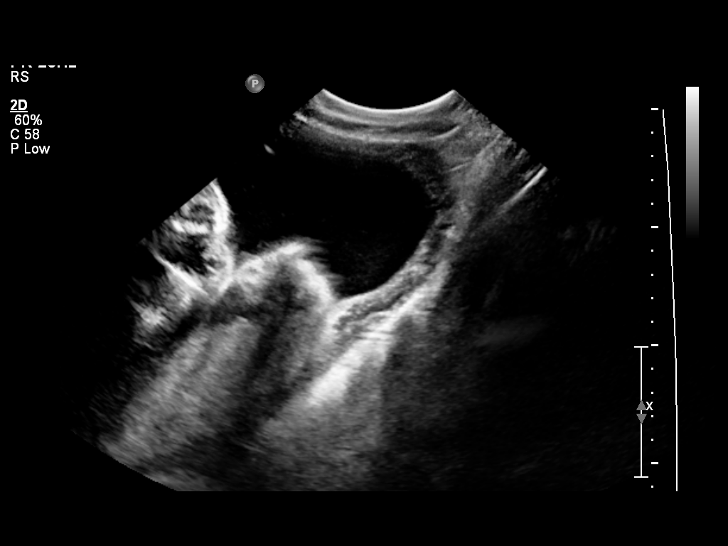
[im 11/37]
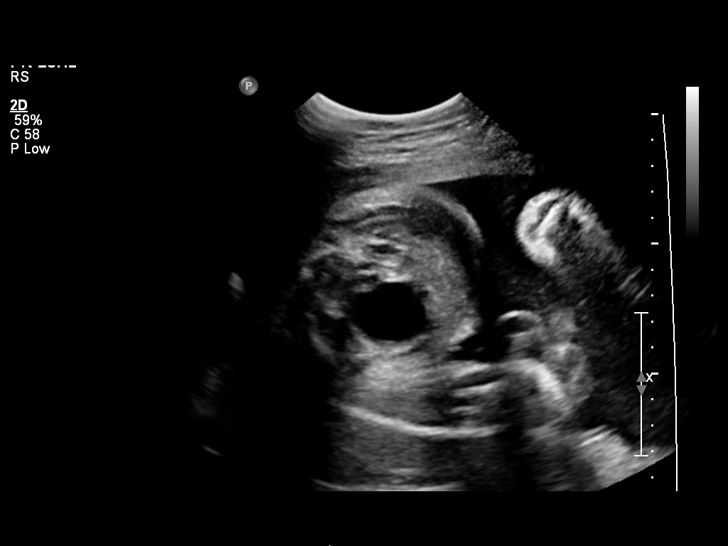
[im 14/37]
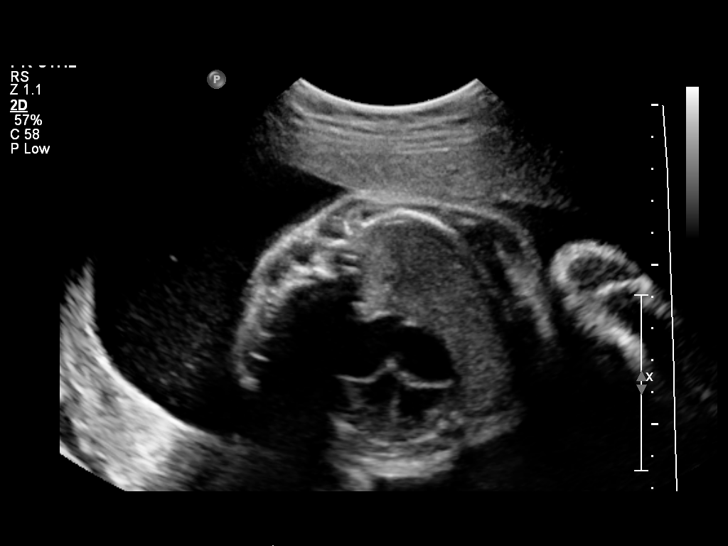
[im 17/37]
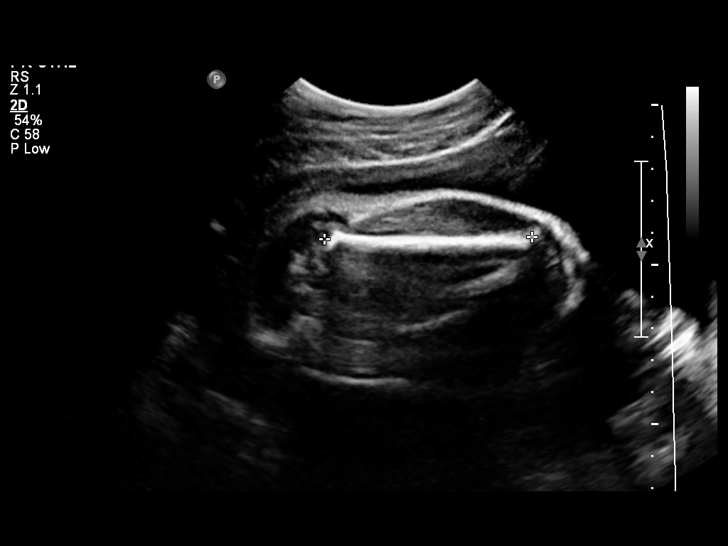
[im 21/37]
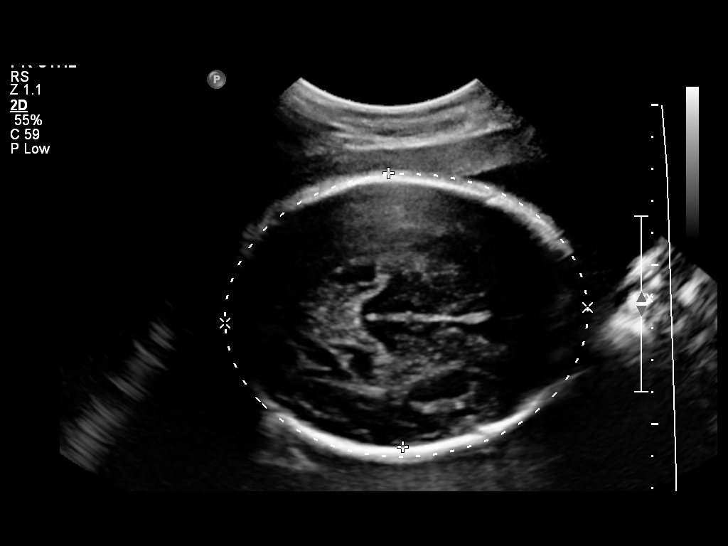
[im 23/37]
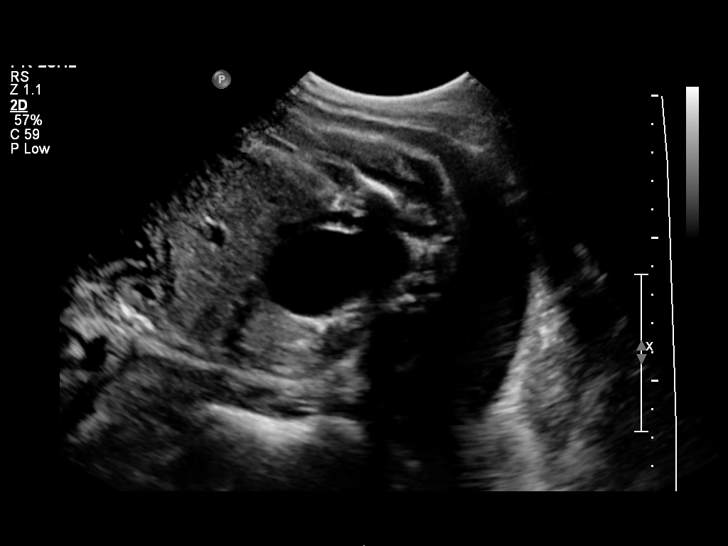
[im 26/37]
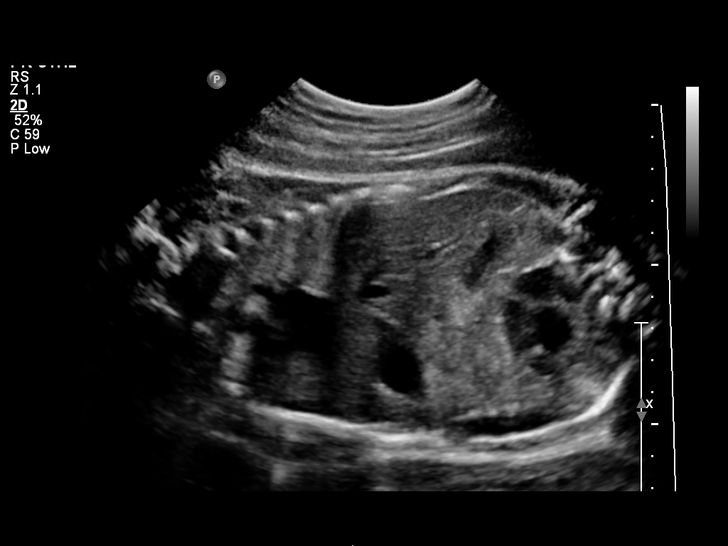
[im 30/37]
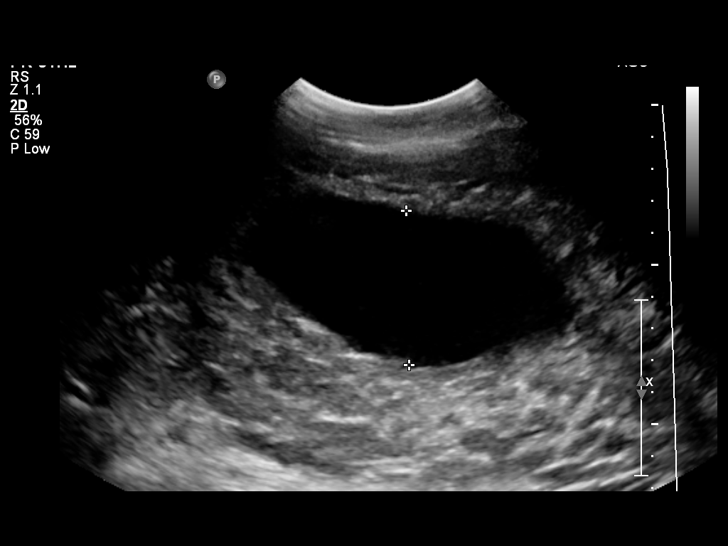
[im 33/37]
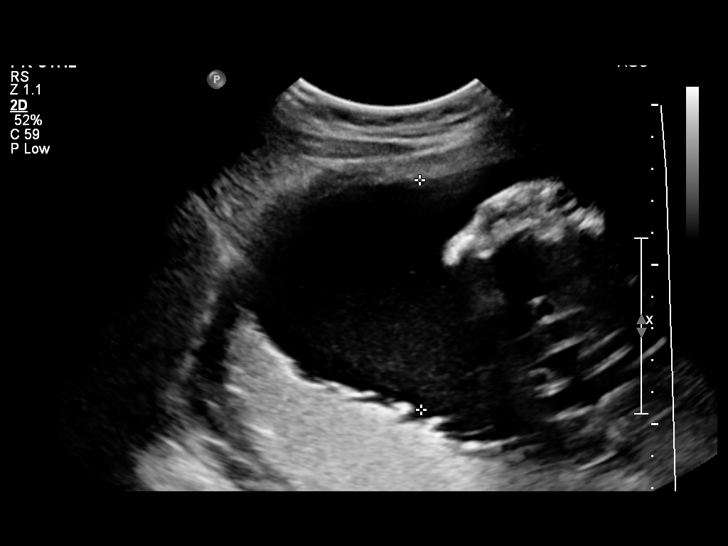
[im 35/37]
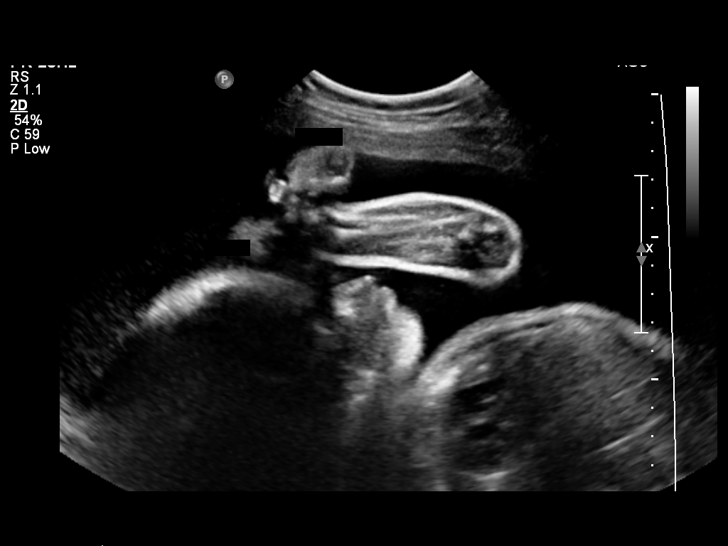

[12 of 28 positions shown; findings below may reference images not displayed]

OBSTETRICS REPORT
                      (Signed Final 05/06/2013 [DATE])

Service(s) Provided

 US OB FOLLOW UP                                       76816.1
Indications

 2 vessel umbilical cord
Fetal Evaluation

 Num Of Fetuses:    1
 Fetal Heart Rate:  130                          bpm
 Cardiac Activity:  Observed
 Presentation:      Frank breech
 Placenta:          Posterior, above cervical
                    os
 P. Cord            Previously Visualized
 Insertion:

 Amniotic Fluid
 AFI FV:      Subjectively within normal limits
 AFI Sum:     20.93   cm       79  %Tile     Larg Pckt:    7.17  cm
 RUQ:   4.82    cm   RLQ:    2.36   cm    LUQ:   7.17    cm   LLQ:    6.58   cm
Biometry

 BPD:     85.7  mm     G. Age:  34w 4d                CI:         72.4   70 - 86
                                                      FL/HC:      20.2   19.4 -

 HC:     320.4  mm     G. Age:  36w 1d       68  %    HC/AC:      1.06   0.96 -

 AC:       302  mm     G. Age:  34w 1d       58  %    FL/BPD:     75.4   71 - 87
 FL:      64.6  mm     G. Age:  33w 2d       23  %    FL/AC:      21.4   20 - 24

 Est. FW:    7244  gm      5 lb 3 oz     63  %
Gestational Age

 LMP:           34w 0d        Date:  09/09/12                 EDD:   06/16/13
 U/S Today:     34w 4d                                        EDD:   06/12/13
 Best:          34w 0d     Det. By:  LMP  (09/09/12)          EDD:   06/16/13
Anatomy

 Cranium:          Appears normal         Aortic Arch:      Previously seen
 Fetal Cavum:      Previously seen        Ductal Arch:      Previously seen
 Ventricles:       Appears normal         Diaphragm:        Appears normal
 Choroid Plexus:   Previously seen        Stomach:          Appears normal
 Cerebellum:       Previously seen        Abdomen:          Appears normal
 Posterior Fossa:  Previously seen        Abdominal Wall:   Previously seen
 Nuchal Fold:      Previously seen        Cord Vessels:     2 vessel cord,
                                                            absent right [place]
                                                            [place]
 Face:             Orbits and profile     Kidneys:          Appear normal
                   previously seen
 Lips:             Previously seen        Bladder:          Appears normal
 Heart:            Appears normal         Spine:            Previously seen
                   (4CH, axis, and
                   situs)
 RVOT:             Previously seen        Lower             Previously seen
                                          Extremities:
 LVOT:             Previously seen        Upper             Previously seen
                                          Extremities:

 Other:  Nasal bone, Heels, and 5th digit previously visualized. Male gender.
         Technically difficult due to fetal position.
Cervix Uterus Adnexa

 Cervix:       Not visualized (advanced GA >34 wks)

 Adnexa:     No abnormality visualized.
Impression

 Single IUP at 34 0/7 weeks
 Single umbilical artery
 Interval growth is appropriate (63rd %tile)
 Normal interval anatomy
 Normal amniotic fluid volume
Recommendations

 Follow-up ultrasounds as clinically indicated.

 questions or concerns.

## 2014-06-22 ENCOUNTER — Encounter: Payer: Self-pay | Admitting: Obstetrics & Gynecology

## 2014-08-11 ENCOUNTER — Ambulatory Visit (INDEPENDENT_AMBULATORY_CARE_PROVIDER_SITE_OTHER): Payer: 59 | Admitting: Emergency Medicine

## 2014-08-11 VITALS — BP 112/60 | HR 78 | Temp 97.9°F | Resp 18 | Ht 65.0 in | Wt 105.8 lb

## 2014-08-11 DIAGNOSIS — R1032 Left lower quadrant pain: Secondary | ICD-10-CM

## 2014-08-11 DIAGNOSIS — N3000 Acute cystitis without hematuria: Secondary | ICD-10-CM

## 2014-08-11 LAB — POCT URINALYSIS DIPSTICK
Bilirubin, UA: NEGATIVE
GLUCOSE UA: NEGATIVE
Ketones, UA: NEGATIVE
NITRITE UA: NEGATIVE
PROTEIN UA: NEGATIVE
Spec Grav, UA: 1.02
UROBILINOGEN UA: 0.2
pH, UA: 6.5

## 2014-08-11 LAB — POCT UA - MICROSCOPIC ONLY
CASTS, UR, LPF, POC: NEGATIVE
CRYSTALS, UR, HPF, POC: NEGATIVE
Mucus, UA: NEGATIVE
YEAST UA: NEGATIVE

## 2014-08-11 LAB — POCT CBC
Granulocyte percent: 51.2 %G (ref 37–80)
HCT, POC: 39.5 % (ref 37.7–47.9)
HEMOGLOBIN: 12.7 g/dL (ref 12.2–16.2)
LYMPH, POC: 3.4 (ref 0.6–3.4)
MCH: 30.7 pg (ref 27–31.2)
MCHC: 32.2 g/dL (ref 31.8–35.4)
MCV: 95.2 fL (ref 80–97)
MID (cbc): 0.7 (ref 0–0.9)
MPV: 7.2 fL (ref 0–99.8)
POC Granulocyte: 4.3 (ref 2–6.9)
POC LYMPH PERCENT: 40.8 %L (ref 10–50)
POC MID %: 8 %M (ref 0–12)
Platelet Count, POC: 312 10*3/uL (ref 142–424)
RBC: 4.14 M/uL (ref 4.04–5.48)
RDW, POC: 12.8 %
WBC: 8.4 10*3/uL (ref 4.6–10.2)

## 2014-08-11 LAB — POCT URINE PREGNANCY: Preg Test, Ur: NEGATIVE

## 2014-08-11 MED ORDER — PHENAZOPYRIDINE HCL 200 MG PO TABS: 200.0000 mg | ORAL_TABLET | Freq: Three times a day (TID) | ORAL | Status: DC | PRN

## 2014-08-11 MED ORDER — CIPROFLOXACIN HCL 500 MG PO TABS: 500.0000 mg | ORAL_TABLET | Freq: Two times a day (BID) | ORAL | Status: DC

## 2014-08-11 NOTE — Progress Notes (Signed)
[facility] [address], [place] [address] [phone]  Date:  08/11/2014   Name:  [name]   DOB:  [birth year]   MRN:  [id number]  PCP:  Pcp Not In System    Chief Complaint: Abdominal Pain and Back Pain   History of Present Illness:  [name] [name] is a 23 y.o. very pleasant female patient who presents with the following:  5 day history of left abdominal into left back.  Worse when walks around  Better when lays down. No dysuria, urgency or frequency.  No nausea or vomiting. No stool change.   No vaginal discharge or bleeding. No dyspareunia No fever or chills No chang with RR tracks.  Pain constant all day long. IUD for contraception No improvement with over the counter medications or other home remedies. Denies other complaint or health concern today.    Patient Active Problem List   Diagnosis Date Noted  . Vaginal delivery 06/24/2013  . Marginal placenta previa in second trimester 01/17/2013  . Two vessel umbilical cord, antepartum 01/17/2013  . Supervision of normal first pregnancy 11/21/2012    Past Medical History  Diagnosis Date  . Tendon injury     L hand    Past Surgical History  Procedure Laterality Date  . Hand surgery      08/2011- artery & tendon repair- post trauma   . Tendon repair  03/25/2012    Procedure: TENDON REPAIR;  Surgeon: Sharma CovertFred W Ortmann, MD;  [facility];  Service: Orthopedics;  Laterality: Left;  left Index Finger Tenolysis     History  Substance Use Topics  . Smoking status: Never Smoker   . Smokeless tobacco: Never Used  . Alcohol Use: No    Family History  Problem Relation Age of Onset  . Alcohol abuse Neg Hx   . Arthritis Neg Hx   . Asthma Neg Hx   . Birth defects Neg Hx   . Cancer Neg Hx   . COPD Neg Hx   . Depression Neg Hx   . Diabetes Neg Hx   . Drug abuse Neg Hx   . Early death Neg Hx   . Hearing loss Neg Hx   . Heart disease Neg Hx   . Hyperlipidemia Neg Hx   . Hypertension Neg Hx   .  Kidney disease Neg Hx   . Learning disabilities Neg Hx   . Mental illness Neg Hx   . Mental retardation Neg Hx   . Miscarriages / Stillbirths Neg Hx   . Stroke Neg Hx   . Vision loss Neg Hx   . Varicose Veins Neg Hx     No Known Allergies  Medication list has been reviewed and updated.  Current Outpatient Prescriptions on File Prior to Visit  Medication Sig Dispense Refill  . levonorgestrel (MIRENA) 20 MCG/24HR IUD 1 Intra Uterine Device (1 each total) by Intrauterine route once. 1 each 0  . Prenatal Vit-Fe Fumarate-FA (MULTIVITAMIN-PRENATAL) 27-0.8 MG TABS Take 1 tablet by mouth daily at 12 noon.     No current facility-administered medications on file prior to visit.    Review of Systems:  As per HPI, otherwise negative.    Physical Examination: Filed Vitals:   08/11/14 1740  BP: 112/60  Pulse: 78  Temp: 97.9 F (36.6 C)  Resp: 18   Filed Vitals:   08/11/14 1740  Height: 5\' 5"  (1.651 m)  Weight: 105 lb 12.8 oz (47.991 kg)   Body  mass index is 17.61 kg/(m^2). Ideal Body Weight: Weight in (lb) to have BMI = 25: 149.9  GEN: WDWN, NAD, Non-toxic, A & O x 3 HEENT: Atraumatic, Normocephalic. Neck supple. No masses, No LAD. Ears and Nose: No external deformity. CV: RRR, No M/G/R. No JVD. No thrill. No extra heart sounds. PULM: CTA B, no wheezes, crackles, rhonchi. No retractions. No resp. distress. No accessory muscle use. ABD: S, NT, ND, +BS. No rebound. No HSM. Mild left CVA tenderness EXTR: No c/c/e NEURO Normal gait.  PSYCH: Normally interactive. Conversant. Not depressed or anxious appearing.  Calm demeanor.    Assessment and Plan: Cystitis cipro  Signed,  Phillips OdorJeffery Gidget Quizhpi, MD  Results for orders placed or performed in visit on 08/11/14  POCT urinalysis dipstick  Result Value Ref Range   Color, UA yellow    Clarity, UA clear    Glucose, UA neg    Bilirubin, UA neg    Ketones, UA neg    Spec Grav, UA 1.020    Blood, UA trace    pH, UA 6.5     Protein, UA neg    Urobilinogen, UA 0.2    Nitrite, UA neg    Leukocytes, UA Trace   POCT UA - Microscopic Only  Result Value Ref Range   WBC, Ur, HPF, POC 3-5    RBC, urine, microscopic 0-2    Bacteria, U Microscopic small    Mucus, UA neg    Epithelial cells, urine per micros 1-3    Crystals, Ur, HPF, POC neg    Casts, Ur, LPF, POC neg    Yeast, UA neg   POCT CBC  Result Value Ref Range   WBC 8.4 4.6 - 10.2 K/uL   Lymph, poc 3.4 0.6 - 3.4   POC LYMPH PERCENT 40.8 10 - 50 %L   MID (cbc) 0.7 0 - 0.9   POC MID % 8.0 0 - 12 %M   POC Granulocyte 4.3 2 - 6.9   Granulocyte percent 51.2 37 - 80 %G   RBC 4.14 4.04 - 5.48 M/uL   Hemoglobin 12.7 12.2 - 16.2 g/dL   HCT, POC 16.139.5 09.637.7 - 47.9 %   MCV 95.2 80 - 97 fL   MCH, POC 30.7 27 - 31.2 pg   MCHC 32.2 31.8 - 35.4 g/dL   RDW, POC 04.512.8 %   Platelet Count, POC 312 142 - 424 K/uL   MPV 7.2 0 - 99.8 fL  POCT urine pregnancy  Result Value Ref Range   Preg Test, Ur Negative

## 2015-05-09 ENCOUNTER — Ambulatory Visit (INDEPENDENT_AMBULATORY_CARE_PROVIDER_SITE_OTHER): Payer: PRIVATE HEALTH INSURANCE | Admitting: Emergency Medicine

## 2015-05-09 VITALS — BP 100/50 | HR 107 | Temp 97.7°F | Resp 18 | Ht 65.0 in | Wt 104.4 lb

## 2015-05-09 DIAGNOSIS — L500 Allergic urticaria: Secondary | ICD-10-CM | POA: Diagnosis not present

## 2015-05-09 MED ORDER — HYDROXYZINE HCL 25 MG PO TABS: 25.0000 mg | ORAL_TABLET | Freq: Three times a day (TID) | ORAL | Status: DC | PRN

## 2015-05-09 MED ORDER — PREDNISONE 10 MG (21) PO TBPK: ORAL_TABLET | ORAL | Status: DC

## 2015-05-09 NOTE — Progress Notes (Signed)
Subjective:  Patient ID: [name], female    DOB: [birth year]  Age: 24 y.o. 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 She made fish last night she describes as "Asian." And immediately after eating and developed generalized hives. She has no difficulty with swelling in the mouth or difficulty swallowing or dysphonia. She denies any fever or chills. She has no wheezing or shortness of breath. She has no cough. She has generalized hives that she's been unable to control with over-the-counter medication she said that her daughter was tested by for allergies and was found to have an allergy to shellfish  History [name] has a past medical history of Tendon injury.   She has past surgical history that includes Hand surgery and Tendon repair (03/25/2012).   Her  family history is negative for Alcohol abuse, Arthritis, Asthma, Birth defects, Cancer, COPD, Depression, Diabetes, Drug abuse, Early death, Hearing loss, Heart disease, Hyperlipidemia, Hypertension, Kidney disease, Learning disabilities, Mental illness, Mental retardation, Miscarriages / Stillbirths, Stroke, Vision loss, and Varicose Veins.  She   reports that she has never smoked. She has never used smokeless tobacco. She reports that she does not drink alcohol or use illicit drugs.  Outpatient Prescriptions Prior to Visit  Medication Sig Dispense Refill  . levonorgestrel (MIRENA) 20 MCG/24HR IUD 1 Intra Uterine Device (1 each total) by Intrauterine route once. 1 each 0  . phenazopyridine (PYRIDIUM) 200 MG tablet Take 1 tablet (200 mg total) by mouth 3 (three) times daily as needed. (Patient not taking: Reported on 05/09/2015) 6 tablet 0  . ciprofloxacin (CIPRO) 500 MG tablet Take 1 tablet (500 mg total) by mouth 2 (two) times daily. 20 tablet 0  . Prenatal Vit-Fe Fumarate-FA (MULTIVITAMIN-PRENATAL) 27-0.8 MG TABS Take 1 tablet by mouth daily at 12 noon.      No facility-administered medications prior to visit.    Social History   Social History  . Marital Status: Married    Spouse Name: N/A  . Number of Children: N/A  . Years of Education: N/A   Social History Main Topics  . Smoking status: Never Smoker   . Smokeless tobacco: Never Used  . Alcohol Use: No  . Drug Use: No  . Sexual Activity: Yes   Other Topics Concern  . None   Social History Narrative     Review of Systems  Constitutional: Negative for fever, chills and appetite change.  HENT: Negative for congestion, ear pain, postnasal drip, sinus pressure and sore throat.   Eyes: Negative for pain and redness.  Respiratory: Negative for cough, shortness of breath and wheezing.   Cardiovascular: Negative for leg swelling.  Gastrointestinal: Negative for nausea, vomiting, abdominal pain, diarrhea, constipation and blood in stool.  Endocrine: Negative for polyuria.  Genitourinary: Negative for dysuria, urgency, frequency and flank pain.  Musculoskeletal: Negative for gait problem.  Skin: Negative for rash.  Neurological: Negative for weakness and headaches.  Psychiatric/Behavioral: Negative for confusion and decreased concentration. The patient is not nervous/anxious.     Objective:  BP 100/50 mmHg  Pulse 107  Temp(Src) 97.7 F (36.5 C) (Oral)  Resp 18  Ht  (1.651 m)  Wt 104 lb 6 oz (47.344 kg)  BMI 17.37 kg/m2  SpO2 98%  Breastfeeding? Yes  Physical Exam  Constitutional: She is oriented to person, place, and time. She appears well-developed and well-nourished. No distress.  HENT:  Head: Normocephalic and atraumatic.  Right Ear: External ear normal.  Left Ear: External ear normal.  Nose: Nose normal.  Eyes: Conjunctivae and EOM are normal. Pupils are equal, round, and reactive to light. No scleral icterus.  Neck: Normal range of motion. Neck supple. No tracheal deviation present.  Cardiovascular: Normal rate, regular rhythm and normal heart sounds.    Pulmonary/Chest: Effort normal. No respiratory distress. She has no wheezes. She has no rales.  Abdominal: She exhibits no mass. There is no tenderness. There is no rebound and no guarding.  Musculoskeletal: She exhibits no edema.  Lymphadenopathy:    She has no cervical adenopathy.  Neurological: She is alert and oriented to person, place, and time. Coordination normal.  Skin: Skin is warm and dry. Rash noted.  Psychiatric: She has a normal mood and affect. Her behavior is normal.      Assessment & Plan:   [name] was seen today for allergic reaction.  Diagnoses and all orders for this visit:  Allergic urticaria  Other orders -     predniSONE (STERAPRED UNI-PAK 21 TAB) 10 MG (21) TBPK tablet; As directed on package -     hydrOXYzine (ATARAX/VISTARIL) 25 MG tablet; Take 1 tablet (25 mg total) by mouth 3 (three) times daily as needed for itching.   I have discontinued Ms. Pfahler's multivitamin-prenatal and ciprofloxacin. I am also having her start on predniSONE and hydrOXYzine. Additionally, I am having her maintain her levonorgestrel, phenazopyridine, and diphenhydrAMINE.  Meds ordered this encounter  Medications  . diphenhydrAMINE (BENADRYL) 12.5 MG/5ML elixir    Sig: Take 25 mg by mouth 2 (two) times daily as needed for allergies.  . predniSONE (STERAPRED UNI-PAK 21 TAB) 10 MG (21) TBPK tablet    Sig: As directed on package    Dispense:  21 tablet    Refill:  0  . hydrOXYzine (ATARAX/VISTARIL) 25 MG tablet    Sig: Take 1 tablet (25 mg total) by mouth 3 (three) times daily as needed for itching.    Dispense:  30 tablet    Refill:  0    Appropriate red flag conditions were discussed with the patient as well as actions that should be taken.  Patient expressed his understanding.  Follow-up: Return if symptoms worsen or fail to improve.  Carmelina Dane, MD

## 2015-05-09 NOTE — Patient Instructions (Signed)
Hives Hives are itchy, red, swollen areas of the skin. They can vary in size and location on your body. Hives can come and go for hours or several days (acute hives) or for several weeks (chronic hives). Hives do not spread from person to person (noncontagious). They may get worse with scratching, exercise, and emotional stress. CAUSES   Allergic reaction to food, additives, or drugs.  Infections, including the common cold.  Illness, such as vasculitis, lupus, or thyroid disease.  Exposure to sunlight, heat, or cold.  Exercise.  Stress.  Contact with chemicals. SYMPTOMS   Red or white swollen patches on the skin. The patches may change size, shape, and location quickly and repeatedly.  Itching.  Swelling of the hands, feet, and face. This may occur if hives develop deeper in the skin. DIAGNOSIS  Your caregiver can usually tell what is wrong by performing a physical exam. Skin or blood tests may also be done to determine the cause of your hives. In some cases, the cause cannot be determined. TREATMENT  Mild cases usually get better with medicines such as antihistamines. Severe cases may require an emergency epinephrine injection. If the cause of your hives is known, treatment includes avoiding that trigger.  HOME CARE INSTRUCTIONS   Avoid causes that trigger your hives.  Take antihistamines as directed by your caregiver to reduce the severity of your hives. Non-sedating or low-sedating antihistamines are usually recommended. Do not drive while taking an antihistamine.  Take any other medicines prescribed for itching as directed by your caregiver.  Wear loose-fitting clothing.  Keep all follow-up appointments as directed by your caregiver. SEEK MEDICAL CARE IF:   You have persistent or severe itching that is not relieved with medicine.  You have painful or swollen joints. SEEK IMMEDIATE MEDICAL CARE IF:   You have a fever.  Your tongue or lips are swollen.  You have  trouble breathing or swallowing.  You feel tightness in the throat or chest.  You have abdominal pain. These problems may be the first sign of a life-threatening allergic reaction. Call your local emergency services (911 in [place] MAKE SURE YOU:   Understand these instructions.  Will watch your condition.  Will get help right away if you are not doing well or get worse. Document Released: 08/07/2005 Document Revised: 08/12/2013 Document Reviewed: 10/31/2011 ExitCare Patient Information 2015 [employer]. This information is not intended to replace advice given to you by your health care provider. Make sure you discuss any questions you have with your health care provider.  

## 2017-08-06 ENCOUNTER — Ambulatory Visit: Payer: BLUE CROSS/BLUE SHIELD | Admitting: Obstetrics & Gynecology

## 2017-08-06 ENCOUNTER — Encounter: Payer: Self-pay | Admitting: Obstetrics & Gynecology

## 2017-08-06 VITALS — BP 126/74 | HR 80 | Ht 66.0 in | Wt 116.1 lb

## 2017-08-06 DIAGNOSIS — Z30432 Encounter for removal of intrauterine contraceptive device: Secondary | ICD-10-CM

## 2017-08-06 MED ORDER — PRENATAL VITAMINS 0.8 MG PO TABS: 1.0000 | ORAL_TABLET | Freq: Every day | ORAL | 12 refills | Status: AC

## 2017-08-06 NOTE — Progress Notes (Signed)
Pt present for removal of LnIUD. It was placed in 2014. She is interested in getting pregnant next year.  Patient was in the dorsal lithotomy position, normal external genitalia was noted.  A speculum was placed in the patient's vagina, normal discharge was noted, no lesions. The multiparous cervix was visualized, no lesions, no abnormal discharge;  and the cervix was swabbed with Betadine using scopettes. The strings of the IUD were grasped and pulled using ring forceps.  The IUD was successfully removed in its entirety.  Patient tolerated the procedure well.    She was Rx'd PNV.  She will f/u in 1 year or sooner prn pregnancy.  Jiovanni Heeter L. Harraway-Smith, M.D., [facility] CoreFACOG

## 2017-08-06 NOTE — Patient Instructions (Signed)
Preparing for Pregnancy If you are considering becoming pregnant, make an appointment to see your regular health care provider to learn how to prepare for a safe and healthy pregnancy (preconception care). During a preconception care visit, your health care provider will:  Do a complete physical exam, including a Pap test.  Take a complete medical history.  Give you information, answer your questions, and help you resolve problems.  Preconception checklist Medical history  Tell your health care provider about any current or past medical conditions. Your pregnancy or your ability to become pregnant may be affected by chronic conditions, such as diabetes, chronic hypertension, and thyroid problems.  Include your family's medical history as well as your partner's medical history.  Tell your health care provider about any history of STIs (sexually transmitted infections).These can affect your pregnancy. In some cases, they can be passed to your baby. Discuss any concerns that you have about STIs.  If indicated, discuss the benefits of genetic testing. This testing will show whether there are any genetic conditions that may be passed from you or your partner to your baby.  Tell your health care provider about: ? Any problems you have had with conception or pregnancy. ? Any medicines you take. These include vitamins, herbal supplements, and over-the-counter medicines. ? Your history of immunizations. Discuss any vaccinations that you may need.  Diet  Ask your health care provider what to include in a healthy diet that has a balance of nutrients. This is especially important when you are pregnant or preparing to become pregnant.  Ask your health care provider to help you reach a healthy weight before pregnancy. ? If you are overweight, you may be at higher risk for certain complications, such as high blood pressure, diabetes, and preterm birth. ? If you are underweight, you are more likely  to have a baby who has a low birth weight.  Lifestyle, work, and home  Let your health care provider know: ? About any lifestyle habits that you have, such as alcohol use, drug use, or smoking. ? About recreational activities that may put you at risk during pregnancy, such as downhill skiing and certain exercise programs. ? Tell your health care provider about any international travel, especially any travel to places with an active Zika virus outbreak. ? About harmful substances that you may be exposed to at work or at home. These include chemicals, pesticides, radiation, or even litter boxes. ? If you do not feel safe at home.  Mental health  Tell your health care provider about: ? Any history of mental health conditions, including feelings of depression, sadness, or anxiety. ? Any medicines that you take for a mental health condition. These include herbs and supplements.  Home instructions to prepare for pregnancy Lifestyle  Eat a balanced diet. This includes fresh fruits and vegetables, whole grains, lean meats, low-fat dairy products, healthy fats, and foods that are high in fiber. Ask to meet with a nutritionist or registered dietitian for assistance with meal planning and goals.  Get regular exercise. Try to be active for at least 30 minutes a day on most days of the week. Ask your health care provider which activities are safe during pregnancy.  Do not use any products that contain nicotine or tobacco, such as cigarettes and e-cigarettes. If you need help quitting, ask your health care provider.  Do not drink alcohol.  Do not take illegal drugs.  Maintain a healthy weight. Ask your health care provider what weight range is   right for you.  General instructions  Keep an accurate record of your menstrual periods. This makes it easier for your health care provider to determine your baby's due date.  Begin taking prenatal vitamins and folic acid supplements daily as directed by  your health care provider.  Manage any chronic conditions, such as high blood pressure and diabetes, as told by your health care provider. This is important.  How do I know that I am pregnant? You may be pregnant if you have been sexually active and you miss your period. Symptoms of early pregnancy include:  Mild cramping.  Very light vaginal bleeding (spotting).  Feeling unusually tired.  Nausea and vomiting (morning sickness).  If you have any of these symptoms and you suspect that you might be pregnant, you can take a home pregnancy test. These tests check for a hormone in your urine (human chorionic gonadotropin, or hCG). A woman's body begins to make this hormone during early pregnancy. These tests are very accurate. Wait until at least the first day after you miss your period to take one. If the test shows that you are pregnant (you get a positive result), call your health care provider to make an appointment for prenatal care. What should I do if I become pregnant?  Make an appointment with your health care provider as soon as you suspect you are pregnant.  Do not use any products that contain nicotine, such as cigarettes, chewing tobacco, and e-cigarettes. If you need help quitting, ask your health care provider.  Do not drink alcoholic beverages. Alcohol is related to a number of birth defects.  Avoid toxic odors and chemicals.  You may continue to have sexual intercourse if it does not cause pain or other problems, such as vaginal bleeding. This information is not intended to replace advice given to you by your health care provider. Make sure you discuss any questions you have with your health care provider. Document Released: 07/20/2008 Document Revised: 04/04/2016 Document Reviewed: 02/27/2016 Elsevier Interactive Patient Education  2017 Elsevier Inc.  

## 2017-08-21 NOTE — L&D Delivery Note (Signed)
OB/GYN Faculty Practice Delivery Note  [name] is a 27 y.o. G2P1001 s/p SVD at [redacted]w[redacted]d. She was admitted for spontaneous onset of labor.   ROM: 0h 42m with clear fluid GBS Status: negative Maximum Maternal Temperature: Temp (24hrs), Avg:98.3 F (36.8 C), Min:98.1 F (36.7 C), Max:98.4 F (36.9 C)  Labor Progress: . Admitted in active labor . AROM clear fluid  Delivery Date/Time: 05/20/18 at 0644 Delivery: Called to room and patient was complete and pushing. Head delivered LOA. No nuchal cord present. Shoulder and body delivered in usual fashion. Infant with spontaneous cry, placed on mother's abdomen, dried and stimulated. Cord clamped x 2 after 1-minute delay, and cut by father of baby. Infant with urination right after delivery. Cord blood drawn. Placenta delivered spontaneously with gentle cord traction. Fundus firm with massage and Pitocin. Labia, perineum, vagina, and cervix inspected inspected with hemostatic periurethral laceration.   Placenta: spontaneous, intact, 3-vessel cord Complications: none Lacerations: hemostatic right periurethral EBL: 208cc  Postpartum Planning [n/a] message to sent to schedule follow-up  [x]  vaccines UTD  Infant: Vigorous female  APGARs 9, 9  weight pending  Brittnee Gaetano S. Earlene Plater, DO OB/GYN Fellow, Faculty Practice

## 2018-05-16 ENCOUNTER — Telehealth (HOSPITAL_COMMUNITY): Payer: Self-pay | Admitting: *Deleted

## 2018-05-16 ENCOUNTER — Encounter (HOSPITAL_COMMUNITY): Payer: Self-pay | Admitting: *Deleted

## 2018-05-16 NOTE — Telephone Encounter (Signed)
Preadmission screen  

## 2018-05-20 ENCOUNTER — Encounter (HOSPITAL_COMMUNITY): Payer: Self-pay

## 2018-05-20 ENCOUNTER — Inpatient Hospital Stay (HOSPITAL_COMMUNITY)
Admission: AD | Admit: 2018-05-20 | Discharge: 2018-05-22 | DRG: 807 | Disposition: A | Discharge status: Home / Self Care | Payer: Medicaid Other | Attending: Obstetrics & Gynecology | Admitting: Obstetrics & Gynecology

## 2018-05-20 DIAGNOSIS — Z3A41 41 weeks gestation of pregnancy: Secondary | ICD-10-CM

## 2018-05-20 DIAGNOSIS — Z3483 Encounter for supervision of other normal pregnancy, third trimester: Secondary | ICD-10-CM | POA: Diagnosis present

## 2018-05-20 DIAGNOSIS — Z23 Encounter for immunization: Secondary | ICD-10-CM | POA: Diagnosis not present

## 2018-05-20 DIAGNOSIS — O09899 Supervision of other high risk pregnancies, unspecified trimester: Secondary | ICD-10-CM

## 2018-05-20 DIAGNOSIS — O480 Post-term pregnancy: Secondary | ICD-10-CM | POA: Diagnosis not present

## 2018-05-20 LAB — CBC
HCT: 33.1 % — ABNORMAL LOW (ref 36.0–46.0)
HEMOGLOBIN: 10.8 g/dL — AB (ref 12.0–15.0)
MCH: 29.1 pg (ref 26.0–34.0)
MCHC: 32.6 g/dL (ref 30.0–36.0)
MCV: 89.2 fL (ref 78.0–100.0)
Platelets: 276 10*3/uL (ref 150–400)
RBC: 3.71 MIL/uL — ABNORMAL LOW (ref 3.87–5.11)
RDW: 14.1 % (ref 11.5–15.5)
WBC: 17.2 10*3/uL — ABNORMAL HIGH (ref 4.0–10.5)

## 2018-05-20 LAB — TYPE AND SCREEN
ABO/RH(D): B POS
Antibody Screen: NEGATIVE

## 2018-05-20 LAB — POCT FERN TEST: POCT Fern Test: NEGATIVE

## 2018-05-20 LAB — RPR: RPR Ser Ql: NONREACTIVE

## 2018-05-20 MED ORDER — IBUPROFEN 600 MG PO TABS
600.0000 mg | ORAL_TABLET | Freq: Four times a day (QID) | ORAL | Status: DC
  Administered 2018-05-20 – 2018-05-22 (×9): 600 mg via ORAL
  Filled 2018-05-20 (×9): qty 1

## 2018-05-20 MED ORDER — SOD CITRATE-CITRIC ACID 500-334 MG/5ML PO SOLN: 30.0000 mL | ORAL | Status: DC | PRN

## 2018-05-20 MED ORDER — OXYTOCIN 40 UNITS IN LACTATED RINGERS INFUSION - SIMPLE MED
2.5000 [IU]/h | INTRAVENOUS | Status: DC
  Filled 2018-05-20: qty 1000

## 2018-05-20 MED ORDER — FLEET ENEMA 7-19 GM/118ML RE ENEM: 1.0000 | ENEMA | RECTAL | Status: DC | PRN

## 2018-05-20 MED ORDER — COCONUT OIL OIL
1.0000 "application " | TOPICAL_OIL | Status: DC | PRN
  Filled 2018-05-20: qty 120

## 2018-05-20 MED ORDER — LIDOCAINE HCL (PF) 1 % IJ SOLN
30.0000 mL | INTRAMUSCULAR | Status: DC | PRN
  Filled 2018-05-20: qty 30

## 2018-05-20 MED ORDER — FENTANYL CITRATE (PF) 100 MCG/2ML IJ SOLN
100.0000 ug | INTRAMUSCULAR | Status: DC | PRN
  Administered 2018-05-20: 100 ug via INTRAVENOUS
  Filled 2018-05-20: qty 2

## 2018-05-20 MED ORDER — DIBUCAINE 1 % RE OINT: 1.0000 "application " | TOPICAL_OINTMENT | RECTAL | Status: DC | PRN

## 2018-05-20 MED ORDER — OXYTOCIN BOLUS FROM INFUSION
500.0000 mL | Freq: Once | INTRAVENOUS | Status: AC
  Administered 2018-05-20: 500 mL via INTRAVENOUS

## 2018-05-20 MED ORDER — ONDANSETRON HCL 4 MG PO TABS: 4.0000 mg | ORAL_TABLET | ORAL | Status: DC | PRN

## 2018-05-20 MED ORDER — DIPHENHYDRAMINE HCL 25 MG PO CAPS: 25.0000 mg | ORAL_CAPSULE | Freq: Four times a day (QID) | ORAL | Status: DC | PRN

## 2018-05-20 MED ORDER — BENZOCAINE-MENTHOL 20-0.5 % EX AERO
1.0000 "application " | INHALATION_SPRAY | CUTANEOUS | Status: DC | PRN
  Administered 2018-05-20: 1 via TOPICAL
  Filled 2018-05-20: qty 56

## 2018-05-20 MED ORDER — INFLUENZA VAC SPLIT QUAD 0.5 ML IM SUSY
0.5000 mL | PREFILLED_SYRINGE | INTRAMUSCULAR | Status: AC
  Administered 2018-05-21: 0.5 mL via INTRAMUSCULAR
  Filled 2018-05-20: qty 0.5

## 2018-05-20 MED ORDER — SENNOSIDES-DOCUSATE SODIUM 8.6-50 MG PO TABS
2.0000 | ORAL_TABLET | ORAL | Status: DC
  Administered 2018-05-21 – 2018-05-22 (×2): 2 via ORAL
  Filled 2018-05-20 (×2): qty 2

## 2018-05-20 MED ORDER — ONDANSETRON HCL 4 MG/2ML IJ SOLN: 4.0000 mg | INTRAMUSCULAR | Status: DC | PRN

## 2018-05-20 MED ORDER — WITCH HAZEL-GLYCERIN EX PADS: 1.0000 "application " | MEDICATED_PAD | CUTANEOUS | Status: DC | PRN

## 2018-05-20 MED ORDER — FENTANYL CITRATE (PF) 100 MCG/2ML IJ SOLN
INTRAMUSCULAR | Status: AC
  Administered 2018-05-20: 100 ug
  Filled 2018-05-20: qty 2

## 2018-05-20 MED ORDER — ONDANSETRON HCL 4 MG/2ML IJ SOLN: 4.0000 mg | Freq: Four times a day (QID) | INTRAMUSCULAR | Status: DC | PRN

## 2018-05-20 MED ORDER — SIMETHICONE 80 MG PO CHEW: 80.0000 mg | CHEWABLE_TABLET | ORAL | Status: DC | PRN

## 2018-05-20 MED ORDER — PRENATAL MULTIVITAMIN CH
1.0000 | ORAL_TABLET | Freq: Every day | ORAL | Status: DC
  Administered 2018-05-20 – 2018-05-21 (×2): 1 via ORAL
  Filled 2018-05-20 (×2): qty 1

## 2018-05-20 MED ORDER — ACETAMINOPHEN 325 MG PO TABS: 650.0000 mg | ORAL_TABLET | ORAL | Status: DC | PRN

## 2018-05-20 MED ORDER — LACTATED RINGERS IV SOLN: 500.0000 mL | INTRAVENOUS | Status: DC | PRN

## 2018-05-20 MED ORDER — ZOLPIDEM TARTRATE 5 MG PO TABS: 5.0000 mg | ORAL_TABLET | Freq: Every evening | ORAL | Status: DC | PRN

## 2018-05-20 MED ORDER — LACTATED RINGERS IV SOLN
INTRAVENOUS | Status: DC
  Administered 2018-05-20: 04:00:00 via INTRAVENOUS

## 2018-05-20 NOTE — H&P (Signed)
OBSTETRIC ADMISSION HISTORY AND PHYSICAL  Clydene Corker is a 27 y.o. female G2P1001 with IUP at [redacted]w[redacted]d by LMP presenting for spontaneous onset of labor.   Reports fetal movement. Denies vaginal bleeding. Concerned about leakage of fluids but nurse reports feeling BBOW and fern test negative.  She received her prenatal care at [facility].  Support person in labor: husband  Ultrasounds . Anatomy U/S: 20w1 reported to be normal aside from mild right fetal pyelectasis but report not available  Prenatal History/Complications: . None  Past Medical History: Past Medical History:  Diagnosis Date  . Medical history non-contributory   . Tendon injury    L hand    Past Surgical History: Past Surgical History:  Procedure Laterality Date  . HAND SURGERY     08/2011- artery & tendon repair- post trauma   . NO PAST SURGERIES    . TENDON REPAIR  03/25/2012   Procedure: TENDON REPAIR;  Surgeon: Sharma Covert, MD;  Location: [facility] OR;  Service: Orthopedics;  Laterality: Left;  left Index Finger Tenolysis     Obstetrical History: OB History    Gravida  2   Para  1   Term  1   Preterm  0   AB  0   Living  1     SAB  0   TAB  0   Ectopic  0   Multiple  0   Live Births  1           Social History: Social History   Socioeconomic History  . Marital status: Married    Spouse name: Not on file  . Number of children: Not on file  . Years of education: Not on file  . Highest education level: Not on file  Occupational History  . Not on file  Social Needs  . Financial resource strain: Not hard at all  . Food insecurity:    Worry: Never true    Inability: Never true  . Transportation needs:    Medical: No    Non-medical: Not on file  Tobacco Use  . Smoking status: Never Smoker  . Smokeless tobacco: Never Used  Substance and Sexual Activity  . Alcohol use: No    Alcohol/week: 0.0 standard drinks  . Drug use: No  . Sexual activity: Yes  Lifestyle  . Physical activity:     Days per week: Not on file    Minutes per session: Not on file  . Stress: Only a little  Relationships  . Social connections:    Talks on phone: Not on file    Gets together: Not on file    Attends religious service: Not on file    Active member of club or organization: Not on file    Attends meetings of clubs or organizations: Not on file    Relationship status: Not on file  Other Topics Concern  . Not on file  Social History Narrative  . Not on file    Family History: Family History  Problem Relation Age of Onset  . Alcohol abuse Neg Hx   . Arthritis Neg Hx   . Asthma Neg Hx   . Birth defects Neg Hx   . Cancer Neg Hx   . COPD Neg Hx   . Depression Neg Hx   . Diabetes Neg Hx   . Drug abuse Neg Hx   . Early death Neg Hx   . Hearing loss Neg Hx   . Heart disease Neg Hx   .  Hyperlipidemia Neg Hx   . Hypertension Neg Hx   . Kidney disease Neg Hx   . Learning disabilities Neg Hx   . Mental illness Neg Hx   . Mental retardation Neg Hx   . Miscarriages / Stillbirths Neg Hx   . Stroke Neg Hx   . Vision loss Neg Hx   . Varicose Veins Neg Hx     Allergies: No Known Allergies  Medications Prior to Admission  Medication Sig Dispense Refill Last Dose  . Prenatal Multivit-Min-Fe-FA (PRENATAL VITAMINS) 0.8 MG tablet Take 1 tablet by mouth daily. 30 tablet 12 05/19/2018 at Unknown time  . diphenhydrAMINE (BENADRYL) 12.5 MG/5ML elixir Take 25 mg by mouth 2 (two) times daily as needed for allergies.   Not Taking     Review of Systems  All systems reviewed and negative except as stated in HPI  Blood pressure 114/79, pulse 86, temperature 98.4 F (36.9 C), temperature source Oral, resp. rate 19, currently breastfeeding. General appearance: appears uncomfortable during contractions Lungs: no respiratory distress Heart: regular rate  Abdomen: soft, non-tender; gravid Pelvic: deferred Extremities: Homans sign is negative, no sign of DVT Presentation: cephalic per RN  check Fetal monitoring: 120s/mod/+a/-d Uterine activity: regular contractions every 2-4 minutes  Dilation: 4 Effacement (%): 90 Station: -2 Exam by:: Julien Nordmann, RN  Prenatal labs: ABO, Rh:  B+ Antibody:  neg Rubella:  immune RPR:   non-reactive HBsAg:   negative HIV:   negative GBS:   negative Glucola: failed 1hr (184), normal 3hr (87, 184, 139, 107) Genetic screening:  Normal AFP   Prenatal Transfer Tool  Maternal Diabetes: No Genetic Screening: Normal Maternal Ultrasounds/Referrals: Normal though unable to review reports Fetal Ultrasounds or other Referrals:  None Maternal Substance Abuse:  No Significant Maternal Medications:  None Significant Maternal Lab Results: None  Results for orders placed or performed during the hospital encounter of 05/20/18 (from the past 24 hour(s))  POCT fern test   Collection Time: 05/20/18  3:08 AM  Result Value Ref Range   POCT Fern Test Negative = intact amniotic membranes     Patient Active Problem List   Diagnosis Date Noted  . Vaginal delivery 06/24/2013  . Marginal placenta previa in second trimester 01/17/2013  . Two vessel umbilical cord, antepartum 01/17/2013  . Supervision of normal first pregnancy 11/21/2012    Assessment/Plan:  Cynthis Purington is a 26 y.o. G2P1001 at [redacted]w[redacted]d here with spontaneous onset of labor.   Labor: Expectant management, transitioning into active labor -- pain control: considering epidural or IV fentanyl   Fetal Wellbeing: EFW 8lbs by [facility]. Cephalic by RN check.  -- GBS (negative) -- continuous fetal monitoring - continuous   Postpartum Planning -- breast/Mirena -- RI/[x] Tdap   Vivi Piccirilli S. Earlene Plater, DO OB/GYN Fellow

## 2018-05-20 NOTE — Progress Notes (Signed)
OB/GYN Faculty Practice: Labor Progress Note  Subjective: Received one dose of IV fentanyl. Feeling pressure like needs to have bowel movement.   Objective: BP 127/68   Pulse 74   Temp 98.1 F (36.7 C) (Oral)   Resp 18   Ht 5\' 5"  (1.651 m)   Wt 69.9 kg   BMI 25.63 kg/m  Gen: uncomfortable during contractions Dilation: 8 Effacement (%): 90 Cervical Position: Middle Station: -1 Presentation: Vertex Exam by:: L.Eden Toohey  Assessment and Plan: 27 y.o. G2P1001 [redacted]w[redacted]d here with spontaneous onset of labor.  Labor: Expectant, progressing well.  -- AROM clear fluid at 0545 -- pain control: fentanyl, would like to avoid epidural  Fetal Well-Being: EFW 8lbs by Le[facility]. Cephalic by sutures.  -- Category I - continuous fetal monitoring  -- GBS negative   Laasya Peyton S. Earlene Plater, DO OB/GYN Fellow, Faculty Practice  5:54 AM

## 2018-05-20 NOTE — MAU Note (Signed)
Pt presents to MAU c/o ctx every . Pt states she thinks she started leaking a hour ago around 0150. Pt states she saw some red blood in the toilet as well. +FM.

## 2018-05-21 ENCOUNTER — Inpatient Hospital Stay (HOSPITAL_COMMUNITY): Admission: RE | Admit: 2018-05-21 | Payer: BLUE CROSS/BLUE SHIELD | Source: Ambulatory Visit

## 2018-05-21 ENCOUNTER — Encounter (HOSPITAL_COMMUNITY): Payer: Self-pay

## 2018-05-21 LAB — BIRTH TISSUE RECOVERY COLLECTION (PLACENTA DONATION)

## 2018-05-21 NOTE — Lactation Note (Signed)
This note was copied from a baby's chart. Lactation Consultation Note  Patient Name: [name] [id number] Date: 05/21/2018 Reason for consult: Initial assessment;Other (Comment)(exp BF mother - BF 1st baby close to 3 years ) Baby is 40 hours old , 4 % weight loss, breast feeding consistently 10 -30 mins , 4 wets , and 2 stools, passing large amount of gas, also spitty and stuffy , had to use the bulb syringe  several times prior to latch. Stuffiness cleared after feeding for 20 mins, and baby more settled.  Per mom has tender nipples, LC assessed with her permission and noted positional strips on the upper edge of both nipples / intact/ otherwise no break down. LC reviewed hand expressing, several large drops noted, and and had mom repeat. Mom mentioned she has been latching the baby in the cradle position, no pillows. LC stressed the importance of breast massage prior to latch and hand expressing, firm support,  And not to allow the baby to nibble onto the breast. For the Latch / football position used and depth achieved/ and per mom comfortable the entire 20 mins. Nipple well rounded after baby released.  LC had mom apply her EBM to nipples , and encouraged liberally.  Per mom does not have a pump at home, [facility] mentioned she good have a hand pump prior to D/C.  LC recommended and encouraged mom to call her insurance company , ( [employer]/ [place] ) For her benefits DEBP . Mom also mentioned she is active with WIC/[place].  Mother informed of post-discharge support and given phone number to the lactation department, including services for phone call assistance; out-patient appointments; and breastfeeding support group. List of other breastfeeding resources in the community given in the handout. Encouraged mother to call for problems or concerns related to breastfeeding.   Maternal Data Has patient been taught Hand Expression?: Yes Does the patient have breastfeeding experience prior to this  delivery?: Yes  Feeding Feeding Type: Breast Fed Length of feed: 20 min  LATCH Score Latch: Grasps breast easily, tongue down, lips flanged, rhythmical sucking.  Audible Swallowing: Spontaneous and intermittent  Type of Nipple: Everted at rest and after stimulation  Comfort (Breast/Nipple): Soft / non-tender  Hold (Positioning): Assistance needed to correctly position infant at breast and maintain latch.  LATCH Score: 9  Interventions Interventions: Breast feeding basics reviewed;Assisted with latch;Breast compression;Adjust position;Breast massage;Skin to skin;Support pillows;Position options  Lactation Tools Discussed/Used WIC Program: Yes   Consult Status Consult Status: Follow-up Date: 05/22/18 Follow-up type: In-patient    Matilde Sprang Cyani Kallstrom 05/21/2018, 12:25 PM

## 2018-05-21 NOTE — Progress Notes (Signed)
POSTPARTUM PROGRESS NOTE  Post Partum Day 1 Subjective:  [name] is a 27 y.o. [id number] [redacted]w[redacted]d s/p SVD.  No acute events overnight.  Pt denies problems with ambulating, voiding or po intake.  She denies nausea or vomiting.  Pain is moderately controlled.  She has had flatus. She has had bowel movement.  Lochia Moderate, about same as a period and decreasing from yesterday.  Denies chills, HA, RUQ pain, SOB, LE pain.   Objective: Blood pressure 110/71, pulse 80, temperature 98.3 F (36.8 C), temperature source Oral, resp. rate 17, height 5\' 5"  (1.651 m), weight 69.9 kg, SpO2 99 %, unknown if currently breastfeeding.  Physical Exam:  General: alert, cooperative and no distress Lochia:normal flow Chest: CTAB Heart: RRR no m/r/g Abdomen: soft, nontender,  Uterine Fundus: firm, just above umbilicus DVT Evaluation: No calf swelling or tenderness Extremities: no edema  Recent Labs    05/20/18 0346  HGB 10.8*  HCT 33.1*    Assessment/Plan:  ASSESSMENT: [name] is a 27 y.o. [id number] [redacted]w[redacted]d s/p SVD. Doing well, no complaints at this time. Boy, not planning to circ. Breastfeeding Plans outpatient Mirena   LOS: 1 day   Myrtie Hawk, Medical Student 05/21/2018, 9:22 AM

## 2018-05-22 MED ORDER — IBUPROFEN 600 MG PO TABS: 600.0000 mg | ORAL_TABLET | Freq: Four times a day (QID) | ORAL | 0 refills | Status: AC

## 2018-05-22 NOTE — Discharge Summary (Signed)
Postpartum Discharge Summary     Patient Name: [name] DOB: 09-09-1990 MRN: [id number]  Date of admission: 05/20/2018 Delivering Provider: Tamera Stands   Date of discharge: 05/22/2018  Admitting diagnosis: 41wks ctx Intrauterine pregnancy: [redacted]w[redacted]d     Secondary diagnosis:  Active Problems:   Labor and delivery, indication for care  Additional problems: none     Discharge diagnosis: Term Pregnancy Delivered                                                                                                Post partum procedures:none  Augmentation: AROM  Complications: None  Hospital course:  Onset of Labor With Vaginal Delivery     27 y.o. yo [id number] at [redacted]w[redacted]d was admitted in Active Labor on 05/20/2018. Patient had an uncomplicated labor course as follows:  Membrane Rupture Time/Date: 5:47 AM ,05/20/2018   Intrapartum Procedures: Episiotomy: None [1]                                         Lacerations:  1st degree [2]  Patient had a delivery of a Viable infant. 05/20/2018  Information for the patient's newborn:  Zada, Haser [[id number]]  Delivery Method: Vag-Spont    Pateint had an uncomplicated postpartum course.  She is ambulating, tolerating a regular diet, passing flatus, and urinating well. Patient is discharged home in stable condition on 05/22/18.   Magnesium Sulfate recieved: No BMZ received: No  Physical exam  Vitals:   05/21/18 1030 05/21/18 1530 05/21/18 2202 05/22/18 0537  BP: 118/78 104/73 132/77 107/60  Pulse: 71 69 76 88  Resp: 16 15 16 18   Temp: 97.8 F (36.6 C) 98 F (36.7 C) 98.6 F (37 C) 97.7 F (36.5 C)  TempSrc: Oral Oral Oral Oral  SpO2: 100%     Weight:      Height:       General: alert, cooperative and no distress Lochia: appropriate Uterine Fundus: firm Incision: N/A DVT Evaluation: No evidence of DVT seen on physical exam. Labs: Lab Results  Component Value Date   WBC 17.2 (H) 05/20/2018   HGB 10.8 (L) 05/20/2018   HCT  33.1 (L) 05/20/2018   MCV 89.2 05/20/2018   PLT 276 05/20/2018   CMP Latest Ref Rng & Units 04/10/2013  Glucose 70 - 104 mg/dL 88    Discharge instruction: per After Visit Summary and "Baby and Me Booklet".  After visit meds:  Allergies as of 05/22/2018   No Known Allergies     Medication List    TAKE these medications   ibuprofen 600 MG tablet Commonly known as:  ADVIL,MOTRIN Take 1 tablet (600 mg total) by mouth every 6 (six) hours.   Prenatal Vitamins 0.8 MG tablet Take 1 tablet by mouth daily.       Diet: routine diet  Activity: Advance as tolerated. Pelvic rest for 6 weeks.   Outpatient follow up:4 weeks Follow up Appt:No future appointments. Follow up Visit:No follow-ups on file.  Please schedule this patient for Postpartum visit in: 4 weeks with the following provider: Any provider For C/S patients schedule nurse incision check in weeks 2 weeks: no Low risk pregnancy complicated by: none Delivery mode:  SVD Anticipated Birth Control:  other/unsure PP Procedures needed: neg  Schedule Integrated BH visit: no   Newborn Data: Live born female  Birth Weight: 9 lb 2.6 oz (4155 g) APGAR: 9, 9  Newborn Delivery   Birth date/time:  05/20/2018 06:44:00 Delivery type:  Vaginal, Spontaneous     Baby Feeding: Breast Disposition:home with mother   05/22/2018 Wynelle Bourgeois, CNM

## 2018-05-22 NOTE — Plan of Care (Signed)
Progressing appropriately. Encouraged to call for assistance as needed, and for LATCH assessment.  

## 2018-05-22 NOTE — Discharge Instructions (Signed)
Vaginal Delivery, Care After °Refer to this sheet in the next few weeks. These instructions provide you with information about caring for yourself after vaginal delivery. Your health care provider may also give you more specific instructions. Your treatment has been planned according to current medical practices, but problems sometimes occur. Call your health care provider if you have any problems or questions. °What can I expect after the procedure? °After vaginal delivery, it is common to have: °· Some bleeding from your vagina. °· Soreness in your abdomen, your vagina, and the area of skin between your vaginal opening and your anus (perineum). °· Pelvic cramps. °· Fatigue. ° °Follow these instructions at home: °Medicines °· Take over-the-counter and prescription medicines only as told by your health care provider. °· If you were prescribed an antibiotic medicine, take it as told by your health care provider. Do not stop taking the antibiotic until it is finished. °Driving ° °· Do not drive or operate heavy machinery while taking prescription pain medicine. °· Do not drive for 24 hours if you received a sedative. °Lifestyle °· Do not drink alcohol. This is especially important if you are breastfeeding or taking medicine to relieve pain. °· Do not use tobacco products, including cigarettes, chewing tobacco, or e-cigarettes. If you need help quitting, ask your health care provider. °Eating and drinking °· Drink at least 8 eight-ounce glasses of water every day unless you are told not to by your health care provider. If you choose to breastfeed your baby, you may need to drink more water than this. °· Eat high-fiber foods every day. These foods may help prevent or relieve constipation. High-fiber foods include: °? Whole grain cereals and breads. °? Brown rice. °? Beans. °? Fresh fruits and vegetables. °Activity °· Return to your normal activities as told by your health care provider. Ask your health care provider  what activities are safe for you. °· Rest as much as possible. Try to rest or take a nap when your baby is sleeping. °· Do not lift anything that is heavier than your baby or 10 lb (4.5 kg) until your health care provider says that it is safe. °· Talk with your health care provider about when you can engage in sexual activity. This may depend on your: °? Risk of infection. °? Rate of healing. °? Comfort and desire to engage in sexual activity. °Vaginal Care °· If you have an episiotomy or a vaginal tear, check the area every day for signs of infection. Check for: °? More redness, swelling, or pain. °? More fluid or blood. °? Warmth. °? Pus or a bad smell. °· Do not use tampons or douches until your health care provider says this is safe. °· Watch for any blood clots that may pass from your vagina. These may look like clumps of dark red, brown, or black discharge. °General instructions °· Keep your perineum clean and dry as told by your health care provider. °· Wear loose, comfortable clothing. °· Wipe from front to back when you use the toilet. °· Ask your health care provider if you can shower or take a bath. If you had an episiotomy or a perineal tear during labor and delivery, your health care provider may tell you not to take baths for a certain length of time. °· Wear a bra that supports your breasts and fits you well. °· If possible, have someone help you with household activities and help care for your baby for at least a few days after   you leave the hospital. °· Keep all follow-up visits for you and your baby as told by your health care provider. This is important. °Contact a health care provider if: °· You have: °? Vaginal discharge that has a bad smell. °? Difficulty urinating. °? Pain when urinating. °? A sudden increase or decrease in the frequency of your bowel movements. °? More redness, swelling, or pain around your episiotomy or vaginal tear. °? More fluid or blood coming from your episiotomy or  vaginal tear. °? Pus or a bad smell coming from your episiotomy or vaginal tear. °? A fever. °? A rash. °? Little or no interest in activities you used to enjoy. °? Questions about caring for yourself or your baby. °· Your episiotomy or vaginal tear feels warm to the touch. °· Your episiotomy or vaginal tear is separating or does not appear to be healing. °· Your breasts are painful, hard, or turn red. °· You feel unusually sad or worried. °· You feel nauseous or you vomit. °· You pass large blood clots from your vagina. If you pass a blood clot from your vagina, save it to show to your health care provider. Do not flush blood clots down the toilet without having your health care provider look at them. °· You urinate more than usual. °· You are dizzy or light-headed. °· You have not breastfed at all and you have not had a menstrual period for 12 weeks after delivery. °· You have stopped breastfeeding and you have not had a menstrual period for 12 weeks after you stopped breastfeeding. °Get help right away if: °· You have: °? Pain that does not go away or does not get better with medicine. °? Chest pain. °? Difficulty breathing. °? Blurred vision or spots in your vision. °? Thoughts about hurting yourself or your baby. °· You develop pain in your abdomen or in one of your legs. °· You develop a severe headache. °· You faint. °· You bleed from your vagina so much that you fill two sanitary pads in one hour. °This information is not intended to replace advice given to you by your health care provider. Make sure you discuss any questions you have with your health care provider. °Document Released: 08/04/2000 Document Revised: 01/19/2016 Document Reviewed: 08/22/2015 °Elsevier Interactive Patient Education © 2018 [employer] ° °

## 2018-05-22 NOTE — Lactation Note (Signed)
This note was copied from a baby's chart. Lactation Consultation Note  Patient Name: [name] [id number] Date: 05/22/2018 Reason for consult: Follow-up assessment;Nipple pain/trauma;Other (Comment)(post term )  Baby is 92 hours old .  As LC entered the room , mom sitting up in bed and holding baby whom is sound asleep.  Per mom the baby last fed at 730 am and 830 am - total 72 ml of formula.  Mom mentioned the baby was cluster feeding and she was exhausted and asked for a  Bottle.  When LC in the room , baby gaggy , and spitting up some undigested formula, LC used the  Bulb x 1, and burped baby , he had a good burp and settled down.  Yesterday nipples were sore with positional strips. LC offered to assess this am , and noted the  Right to be better, positional strip gone, and the left nipple has a new positional vertical strip / intact.  LC reviewed hand expressing and encouraged to use EBM to the nipples liberally.  Sore nipple and engorgement prevention and tx reviewed.  LC instructed mom on the use of a hand pump/ and increased flange to #27 F for when milk  Comes in.  LC reminded mom the more she breast feeds the baby will stool more , and the spitty will  Decrease.  Mother informed of post-discharge support and given phone number to the lactation department, including services for phone call assistance; out-patient appointments; and breastfeeding support group. List of other breastfeeding resources in the community given in the handout. Encouraged mother to call for problems or concerns related to breastfeeding.   Maternal Data Has patient been taught Hand Expression?: Yes(LC reviewed )  Feeding Feeding Type: (recently fed at 830 am per mom / formula ) Nipple Type: Slow - flow  LATCH Score                   Interventions Interventions: Breast feeding basics reviewed  Lactation Tools Discussed/Used Tools: Pump;Flanges Flange Size: 27;24;Other (comment)(#27 fro  when milk comes in ) Breast pump type: Manual Pump Review: Setup, frequency, and cleaning;Milk Storage Initiated by:: MAI  Date initiated:: 05/22/18   Consult Status Consult Status: Complete Date: 05/22/18    Matilde Sprang Acey [place] 05/22/2018, 9:52 AM

## 2024-09-10 ENCOUNTER — Inpatient Hospital Stay: Admitting: Nurse Practitioner

## 2024-09-10 ENCOUNTER — Inpatient Hospital Stay
